# Patient Record
Sex: Female | Born: 1995 | Race: Black or African American | Hispanic: No | Marital: Single | Smoking: Never smoker
Health system: Southern US, Community
[De-identification: ages and names within clinical notes are randomized; demographics above are authoritative.]

## PROBLEM LIST (undated history)

## (undated) ENCOUNTER — Inpatient Hospital Stay: Payer: Self-pay

## (undated) ENCOUNTER — Ambulatory Visit: Admission: EM | Payer: 59 | Source: Home / Self Care

## (undated) DIAGNOSIS — F419 Anxiety disorder, unspecified: Secondary | ICD-10-CM

## (undated) DIAGNOSIS — J45909 Unspecified asthma, uncomplicated: Secondary | ICD-10-CM

## (undated) HISTORY — DX: Anxiety disorder, unspecified: F41.9

---
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
## 2017-01-02 ENCOUNTER — Ambulatory Visit (INDEPENDENT_AMBULATORY_CARE_PROVIDER_SITE_OTHER): Payer: Medicaid Other | Admitting: Certified Nurse Midwife

## 2017-01-02 ENCOUNTER — Other Ambulatory Visit: Payer: Self-pay | Admitting: Obstetrics and Gynecology

## 2017-01-02 ENCOUNTER — Encounter: Payer: Self-pay | Admitting: Certified Nurse Midwife

## 2017-01-02 VITALS — BP 104/67 | HR 89 | Wt 180.7 lb

## 2017-01-02 DIAGNOSIS — Z3403 Encounter for supervision of normal first pregnancy, third trimester: Secondary | ICD-10-CM

## 2017-01-02 DIAGNOSIS — Z23 Encounter for immunization: Secondary | ICD-10-CM

## 2017-01-02 DIAGNOSIS — Z369 Encounter for antenatal screening, unspecified: Secondary | ICD-10-CM

## 2017-01-02 DIAGNOSIS — Z113 Encounter for screening for infections with a predominantly sexual mode of transmission: Secondary | ICD-10-CM

## 2017-01-02 NOTE — Progress Notes (Addendum)
ROB- pt is still having tightness and pressure, flu vaccine today, pt unable to give urine sample today

## 2017-01-02 NOTE — Patient Instructions (Signed)

## 2017-01-02 NOTE — Addendum Note (Signed)
Addended by: Garfield CorneaMABRY, Deane Wattenbarger L on: 01/02/2017 03:55 PM   Modules accepted: Orders

## 2017-01-02 NOTE — Progress Notes (Signed)
ROB doing well. No complaints. Discussed GBS results from hospital and need to repeat test for sensitivity. Pt verbalizes understanding and agrees to plan. GBS repeated today. Follow up 1 1/2 wks .   Doreene BurkeAnnie Ronisha Herringshaw, CNM

## 2017-01-07 LAB — STREP GP B SUSCEPTIBILITY

## 2017-01-07 LAB — STREP GP B NAA+RFLX: Strep Gp B NAA+Rflx: POSITIVE — AB

## 2017-01-09 ENCOUNTER — Encounter: Payer: Medicaid Other | Admitting: Certified Nurse Midwife

## 2017-01-12 ENCOUNTER — Ambulatory Visit (INDEPENDENT_AMBULATORY_CARE_PROVIDER_SITE_OTHER): Payer: Medicaid Other | Admitting: Obstetrics and Gynecology

## 2017-01-12 VITALS — BP 128/66 | HR 92 | Wt 185.5 lb

## 2017-01-12 DIAGNOSIS — Z3493 Encounter for supervision of normal pregnancy, unspecified, third trimester: Secondary | ICD-10-CM

## 2017-01-12 LAB — POCT URINALYSIS DIPSTICK
BILIRUBIN UA: NEGATIVE
GLUCOSE UA: NEGATIVE
KETONES UA: NEGATIVE
Nitrite, UA: NEGATIVE
PH UA: 7.5 (ref 5.0–8.0)
Protein, UA: NEGATIVE
RBC UA: NEGATIVE
Spec Grav, UA: 1.01 (ref 1.010–1.025)
Urobilinogen, UA: 0.2 E.U./dL

## 2017-01-12 NOTE — Progress Notes (Signed)
ROB-LABOR PRECAUTIONS DISCUSSED, Gc/CMZ repeated.

## 2017-01-12 NOTE — Progress Notes (Signed)
ROB- pt is having some pelvic pressure, hurting in her lower back

## 2017-01-14 LAB — GC/CHLAMYDIA PROBE AMP
CHLAMYDIA, DNA PROBE: NEGATIVE
NEISSERIA GONORRHOEAE BY PCR: NEGATIVE

## 2017-01-16 ENCOUNTER — Other Ambulatory Visit: Payer: Self-pay | Admitting: *Deleted

## 2017-01-16 ENCOUNTER — Telehealth: Payer: Self-pay | Admitting: Obstetrics and Gynecology

## 2017-01-16 ENCOUNTER — Encounter: Payer: Self-pay | Admitting: *Deleted

## 2017-01-16 NOTE — Telephone Encounter (Signed)
Patient called and stated that she needs a not for work, the patient stated that "she is due in 1 week and her job stated that she need to come in to work or they will take her off the schedule." The patient stated that she is uncomfortable and not able to work and stand on her feet at work. The patient did not disclose any other information but would like to have a call back from a nurse. Please advise.

## 2017-01-16 NOTE — Telephone Encounter (Signed)
Note done put up front

## 2017-01-17 ENCOUNTER — Encounter: Payer: Self-pay | Admitting: *Deleted

## 2017-01-17 ENCOUNTER — Other Ambulatory Visit: Payer: Self-pay

## 2017-01-17 ENCOUNTER — Inpatient Hospital Stay
Admission: EM | Admit: 2017-01-17 | Discharge: 2017-01-17 | Disposition: A | Discharge status: Home / Self Care | Payer: BC Managed Care – PPO | Attending: Obstetrics and Gynecology | Admitting: Obstetrics and Gynecology

## 2017-01-17 DIAGNOSIS — Z3A36 36 weeks gestation of pregnancy: Secondary | ICD-10-CM

## 2017-01-17 DIAGNOSIS — O26893 Other specified pregnancy related conditions, third trimester: Secondary | ICD-10-CM | POA: Diagnosis not present

## 2017-01-17 DIAGNOSIS — M545 Low back pain: Secondary | ICD-10-CM

## 2017-01-17 DIAGNOSIS — O4703 False labor before 37 completed weeks of gestation, third trimester: Secondary | ICD-10-CM

## 2017-01-17 NOTE — Discharge Instructions (Signed)
Come back if: ° °Big gush of fluids °Decreased fetal movement °Heavy vaginal bleeding °Temp over 100.4 °Contractions every 3-5 min lasting at least one hour ° °Get plenty of rest and stay well hydrated! °

## 2017-01-17 NOTE — OB Triage Provider Note (Signed)
L&D OB Triage Note  Savannah CoyerBrianna N Carson is a 21 y.o. G1P0 female at 8467w5d, EDD Estimated Date of Delivery: 02/09/17 who presented to triage for complaints of irregular uterine contractions since last night and low back pain.  She was evaluated with no significant findings/findings significant for labor. Vital signs stable. An NST was performed and has been reviewed by me. She was ambulating for an hour and no cervical change. Cervix 2-3/60/-2, uterine contractions q425minutes and mild to palpation.  NST INTERPRETATION: Indications: rule out uterine contractions  Mode: External Baseline Rate (A): 145 bpm Variability: Moderate            Impression: reactive   Plan: NST performed was reviewed and was found to be reactive. She was discharged home with bleeding/labor precautions.  Continue routine prenatal care. Follow up with OB/GYN as previously scheduled.     Datron Brakebill [name], CNM

## 2017-01-17 NOTE — OB Triage Note (Signed)
Recvd to OBS4 per wheelchair, from ED, with c/o contractions.  Changed to gown and to bed.  Oriented to room and plan of care discussed.  Verbalized understanding.and agrees with plan.

## 2017-01-19 ENCOUNTER — Ambulatory Visit (INDEPENDENT_AMBULATORY_CARE_PROVIDER_SITE_OTHER): Payer: Medicaid Other | Admitting: Obstetrics and Gynecology

## 2017-01-19 VITALS — BP 129/67 | HR 87 | Wt 186.6 lb

## 2017-01-19 DIAGNOSIS — Z3493 Encounter for supervision of normal pregnancy, unspecified, third trimester: Secondary | ICD-10-CM

## 2017-01-19 LAB — POCT URINALYSIS DIPSTICK
BILIRUBIN UA: NEGATIVE
GLUCOSE UA: NEGATIVE
Ketones, UA: NEGATIVE
NITRITE UA: NEGATIVE
Protein, UA: NEGATIVE
Spec Grav, UA: 1.01 (ref 1.010–1.025)
Urobilinogen, UA: 0.2 E.U./dL
pH, UA: 6 (ref 5.0–8.0)

## 2017-01-19 NOTE — Progress Notes (Signed)
ROB-doing well, less cramping than before. Labor precautions discussed.

## 2017-01-19 NOTE — Progress Notes (Signed)
ROB- pt is having some menstrual cramping, some pressure

## 2017-01-26 ENCOUNTER — Ambulatory Visit (INDEPENDENT_AMBULATORY_CARE_PROVIDER_SITE_OTHER): Payer: Medicaid Other | Admitting: Certified Nurse Midwife

## 2017-01-26 VITALS — BP 113/62 | HR 82 | Wt 188.4 lb

## 2017-01-26 DIAGNOSIS — Z3493 Encounter for supervision of normal pregnancy, unspecified, third trimester: Secondary | ICD-10-CM

## 2017-01-26 LAB — POCT URINALYSIS DIPSTICK
Bilirubin, UA: NEGATIVE
Blood, UA: NEGATIVE
Glucose, UA: NEGATIVE
Ketones, UA: NEGATIVE
NITRITE UA: NEGATIVE
PH UA: 6 (ref 5.0–8.0)
PROTEIN UA: NEGATIVE
Spec Grav, UA: 1.01 (ref 1.010–1.025)
Urobilinogen, UA: 0.2 E.U./dL

## 2017-01-26 NOTE — Progress Notes (Signed)
ROB- pt is having low back pain, pelvic pressure, having some contractions

## 2017-01-26 NOTE — Patient Instructions (Signed)
Vaginal Delivery Vaginal delivery means that you will give birth by pushing your baby out of your birth canal (vagina). A team of health care providers will help you before, during, and after vaginal delivery. Birth experiences are unique for every woman and every pregnancy, and birth experiences vary depending on where you choose to give birth. What should I do to prepare for my baby's birth? Before your baby is born, it is important to talk with your health care provider about:  Your labor and delivery preferences. These may include: ? Medicines that you may be given. ? How you will manage your pain. This might include non-medical pain relief techniques or injectable pain relief such as epidural analgesia. ? How you and your baby will be monitored during labor and delivery. ? Who may be in the labor and delivery room with you. ? Your feelings about surgical delivery of your baby (cesarean delivery, or C-section) if this becomes necessary. ? Your feelings about receiving donated blood through an IV tube (blood transfusion) if this becomes necessary.  Whether you are able: ? To take pictures or videos of the birth. ? To eat during labor and delivery. ? To move around, walk, or change positions during labor and delivery.  What to expect after your baby is born, such as: ? Whether delayed umbilical cord clamping and cutting is offered. ? Who will care for your baby right after birth. ? Medicines or tests that may be recommended for your baby. ? Whether breastfeeding is supported in your hospital or birth center. ? How long you will be in the hospital or birth center.  How any medical conditions you have may affect your baby or your labor and delivery experience.  To prepare for your baby's birth, you should also:  Attend all of your health care visits before delivery (prenatal visits) as recommended by your health care provider. This is important.  Prepare your home for your baby's  arrival. Make sure that you have: ? Diapers. ? Baby clothing. ? Feeding equipment. ? Safe sleeping arrangements for you and your baby.  Install a car seat in your vehicle. Have your car seat checked by a certified car seat installer to make sure that it is installed safely.  Think about who will help you with your new baby at home for at least the first several weeks after delivery.  What can I expect when I arrive at the birth center or hospital? Once you are in labor and have been admitted into the hospital or birth center, your health care provider may:  Review your pregnancy history and any concerns you have.  Insert an IV tube into one of your veins. This is used to give you fluids and medicines.  Check your blood pressure, pulse, temperature, and heart rate (vital signs).  Check whether your bag of water (amniotic sac) has broken (ruptured).  Talk with you about your birth plan and discuss pain control options.  Monitoring Your health care provider may monitor your contractions (uterine monitoring) and your baby's heart rate (fetal monitoring). You may need to be monitored:  Often, but not continuously (intermittently).  All the time or for long periods at a time (continuously). Continuous monitoring may be needed if: ? You are taking certain medicines, such as medicine to relieve pain or make your contractions stronger. ? You have pregnancy or labor complications.  Monitoring may be done by:  Placing a special stethoscope or a handheld monitoring device on your abdomen to   check your baby's heartbeat, and feeling your abdomen for contractions. This method of monitoring does not continuously record your baby's heartbeat or your contractions.  Placing monitors on your abdomen (external monitors) to record your baby's heartbeat and the frequency and length of contractions. You may not have to wear external monitors all the time.  Placing monitors inside of your uterus  (internal monitors) to record your baby's heartbeat and the frequency, length, and strength of your contractions. ? Your health care provider may use internal monitors if he or she needs more information about the strength of your contractions or your baby's heart rate. ? Internal monitors are put in place by passing a thin, flexible wire through your vagina and into your uterus. Depending on the type of monitor, it may remain in your uterus or on your baby's head until birth. ? Your health care provider will discuss the benefits and risks of internal monitoring with you and will ask for your permission before inserting the monitors.  Telemetry. This is a type of continuous monitoring that can be done with external or internal monitors. Instead of having to stay in bed, you are able to move around during telemetry. Ask your health care provider if telemetry is an option for you.  Physical exam Your health care provider may perform a physical exam. This may include:  Checking whether your baby is positioned: ? With the head toward your vagina (head-down). This is most common. ? With the head toward the top of your uterus (head-up or breech). If your baby is in a breech position, your health care provider may try to turn your baby to a head-down position so you can deliver vaginally. If it does not seem that your baby can be born vaginally, your provider may recommend surgery to deliver your baby. In rare cases, you may be able to deliver vaginally if your baby is head-up (breech delivery). ? Lying sideways (transverse). Babies that are lying sideways cannot be delivered vaginally.  Checking your cervix to determine: ? Whether it is thinning out (effacing). ? Whether it is opening up (dilating). ? How low your baby has moved into your birth canal.  What are the three stages of labor and delivery?  Normal labor and delivery is divided into the following three stages: Stage 1  Stage 1 is the  longest stage of labor, and it can last for hours or days. Stage 1 includes: ? Early labor. This is when contractions may be irregular, or regular and mild. Generally, early labor contractions are more than 10 minutes apart. ? Active labor. This is when contractions get longer, more regular, more frequent, and more intense. ? The transition phase. This is when contractions happen very close together, are very intense, and may last longer than during any other part of labor.  Contractions generally feel mild, infrequent, and irregular at first. They get stronger, more frequent (about every 2-3 minutes), and more regular as you progress from early labor through active labor and transition.  Many women progress through stage 1 naturally, but you may need help to continue making progress. If this happens, your health care provider may talk with you about: ? Rupturing your amniotic sac if it has not ruptured yet. ? Giving you medicine to help make your contractions stronger and more frequent.  Stage 1 ends when your cervix is completely dilated to 4 inches (10 cm) and completely effaced. This happens at the end of the transition phase. Stage 2  Once   your cervix is completely effaced and dilated to 4 inches (10 cm), you may start to feel an urge to push. It is common for the body to naturally take a rest before feeling the urge to push, especially if you received an epidural or certain other pain medicines. This rest period may last for up to 1-2 hours, depending on your unique labor experience.  During stage 2, contractions are generally less painful, because pushing helps relieve contraction pain. Instead of contraction pain, you may feel stretching and burning pain, especially when the widest part of your baby's head passes through the vaginal opening (crowning).  Your health care provider will closely monitor your pushing progress and your baby's progress through the vagina during stage 2.  Your  health care provider may massage the area of skin between your vaginal opening and anus (perineum) or apply warm compresses to your perineum. This helps it stretch as the baby's head starts to crown, which can help prevent perineal tearing. ? In some cases, an incision may be made in your perineum (episiotomy) to allow the baby to pass through the vaginal opening. An episiotomy helps to make the opening of the vagina larger to allow more room for the baby to fit through.  It is very important to breathe and focus so your health care provider can control the delivery of your baby's head. Your health care provider may have you decrease the intensity of your pushing, to help prevent perineal tearing.  After delivery of your baby's head, the shoulders and the rest of the body generally deliver very quickly and without difficulty.  Once your baby is delivered, the umbilical cord may be cut right away, or this may be delayed for 1-2 minutes, depending on your baby's health. This may vary among health care providers, hospitals, and birth centers.  If you and your baby are healthy enough, your baby may be placed on your chest or abdomen to help maintain the baby's temperature and to help you bond with each other. Some mothers and babies start breastfeeding at this time. Your health care team will dry your baby and help keep your baby warm during this time.  Your baby may need immediate care if he or she: ? Showed signs of distress during labor. ? Has a medical condition. ? Was born too early (prematurely). ? Had a bowel movement before birth (meconium). ? Shows signs of difficulty transitioning from being inside the uterus to being outside of the uterus. If you are planning to breastfeed, your health care team will help you begin a feeding. Stage 3  The third stage of labor starts immediately after the birth of your baby and ends after you deliver the placenta. The placenta is an organ that develops  during pregnancy to provide oxygen and nutrients to your baby in the womb.  Delivering the placenta may require some pushing, and you may have mild contractions. Breastfeeding can stimulate contractions to help you deliver the placenta.  After the placenta is delivered, your uterus should tighten (contract) and become firm. This helps to stop bleeding in your uterus. To help your uterus contract and to control bleeding, your health care provider may: ? Give you medicine by injection, through an IV tube, by mouth, or through your rectum (rectally). ? Massage your abdomen or perform a vaginal exam to remove any blood clots that are left in your uterus. ? Empty your bladder by placing a thin, flexible tube (catheter) into your bladder. ? Encourage   you to breastfeed your baby. After labor is over, you and your baby will be monitored closely to ensure that you are both healthy until you are ready to go home. Your health care team will teach you how to care for yourself and your baby. This information is not intended to replace advice given to you by your health care provider. Make sure you discuss any questions you have with your health care provider. Document Released: 12/07/2007 Document Revised: 09/17/2015 Document Reviewed: 03/14/2015 Elsevier Interactive Patient Education  2018 [employer]  

## 2017-01-27 NOTE — Progress Notes (Signed)
ROB-Reports pelvic pressure, low back pain, and occasional contractions. Discussed home treatment measures. Reviewed red flag symptoms and when to call. RTC x 1 week for ROB with Pattricia BossAnnie or sooner if needed.

## 2017-01-30 ENCOUNTER — Ambulatory Visit (INDEPENDENT_AMBULATORY_CARE_PROVIDER_SITE_OTHER): Payer: Medicaid Other | Admitting: Certified Nurse Midwife

## 2017-01-30 ENCOUNTER — Other Ambulatory Visit: Payer: Self-pay

## 2017-01-30 ENCOUNTER — Encounter: Payer: Self-pay | Admitting: Certified Nurse Midwife

## 2017-01-30 VITALS — BP 104/57 | HR 83 | Wt 188.5 lb

## 2017-01-30 DIAGNOSIS — Z3493 Encounter for supervision of normal pregnancy, unspecified, third trimester: Secondary | ICD-10-CM

## 2017-01-30 LAB — POCT URINALYSIS DIPSTICK
BILIRUBIN UA: NEGATIVE
Glucose, UA: NEGATIVE
Ketones, UA: NEGATIVE
NITRITE UA: NEGATIVE
PH UA: 6 (ref 5.0–8.0)
PROTEIN UA: NEGATIVE
RBC UA: NEGATIVE
Spec Grav, UA: 1.02 (ref 1.010–1.025)
UROBILINOGEN UA: 0.2 U/dL

## 2017-01-30 NOTE — Patient Instructions (Signed)

## 2017-01-30 NOTE — Progress Notes (Signed)
Savannah Carson, doing well. Endorses good fetal movement. Discussed induction at 41 wks. She verbalizes understanding and agrees to plan. She declines SVE today. Follow up in 1 wk.   Savannah BurkeAnnie Keishana Carson, CNM

## 2017-02-05 ENCOUNTER — Observation Stay
Admission: EM | Admit: 2017-02-05 | Discharge: 2017-02-06 | Disposition: A | Discharge status: Home / Self Care | Payer: BC Managed Care – PPO | Source: Home / Self Care | Admitting: Obstetrics and Gynecology

## 2017-02-05 ENCOUNTER — Other Ambulatory Visit: Payer: Self-pay

## 2017-02-05 NOTE — Procedures (Addendum)
Spoke with Harlow MaresMelody Shambley, CNM. Notified of patient complaint, UC pattern and EFM pattern.  Patient states she felt fetal movement twice after the monitors were applied. Per CNM monitor patient for 45 minutes and if NST is reactive & no further concerns patient may be discharged home with instructions to keep her weekly appointment.

## 2017-02-05 NOTE — OB Triage Note (Signed)
Patient presented to L&D with complaints of no fetal movement since 1am last night.  Denies leaking fluid, vaginal bleeding. States she has been having some cramping and lower back pain.

## 2017-02-06 ENCOUNTER — Inpatient Hospital Stay: Payer: BC Managed Care – PPO | Admitting: Anesthesiology

## 2017-02-06 ENCOUNTER — Encounter: Payer: Medicaid Other | Admitting: Certified Nurse Midwife

## 2017-02-06 ENCOUNTER — Encounter: Payer: Self-pay | Admitting: Anesthesiology

## 2017-02-06 ENCOUNTER — Inpatient Hospital Stay
Admission: EM | Admit: 2017-02-06 | Discharge: 2017-02-09 | DRG: 806 | Disposition: A | Discharge status: Home / Self Care | Payer: BC Managed Care – PPO | Attending: Obstetrics and Gynecology | Admitting: Obstetrics and Gynecology

## 2017-02-06 DIAGNOSIS — Z3A39 39 weeks gestation of pregnancy: Secondary | ICD-10-CM

## 2017-02-06 DIAGNOSIS — O99824 Streptococcus B carrier state complicating childbirth: Secondary | ICD-10-CM | POA: Diagnosis present

## 2017-02-06 DIAGNOSIS — O368130 Decreased fetal movements, third trimester, not applicable or unspecified: Secondary | ICD-10-CM | POA: Diagnosis present

## 2017-02-06 DIAGNOSIS — Z880 Allergy status to penicillin: Secondary | ICD-10-CM | POA: Diagnosis not present

## 2017-02-06 DIAGNOSIS — J45909 Unspecified asthma, uncomplicated: Secondary | ICD-10-CM | POA: Diagnosis present

## 2017-02-06 DIAGNOSIS — K219 Gastro-esophageal reflux disease without esophagitis: Secondary | ICD-10-CM | POA: Diagnosis present

## 2017-02-06 DIAGNOSIS — F1290 Cannabis use, unspecified, uncomplicated: Secondary | ICD-10-CM | POA: Diagnosis present

## 2017-02-06 DIAGNOSIS — O9952 Diseases of the respiratory system complicating childbirth: Secondary | ICD-10-CM | POA: Diagnosis present

## 2017-02-06 DIAGNOSIS — O9962 Diseases of the digestive system complicating childbirth: Secondary | ICD-10-CM | POA: Diagnosis present

## 2017-02-06 DIAGNOSIS — O99324 Drug use complicating childbirth: Secondary | ICD-10-CM | POA: Diagnosis present

## 2017-02-06 DIAGNOSIS — Z3483 Encounter for supervision of other normal pregnancy, third trimester: Secondary | ICD-10-CM | POA: Diagnosis present

## 2017-02-06 LAB — TYPE AND SCREEN
ABO/RH(D): A POS
ANTIBODY SCREEN: NEGATIVE

## 2017-02-06 LAB — CBC
HEMATOCRIT: 35.6 % (ref 35.0–47.0)
Hemoglobin: 12 g/dL (ref 12.0–16.0)
MCH: 27.2 pg (ref 26.0–34.0)
MCHC: 33.8 g/dL (ref 32.0–36.0)
MCV: 80.4 fL (ref 80.0–100.0)
PLATELETS: 278 10*3/uL (ref 150–440)
RBC: 4.43 MIL/uL (ref 3.80–5.20)
RDW: 13.9 % (ref 11.5–14.5)
WBC: 8.1 10*3/uL (ref 3.6–11.0)

## 2017-02-06 LAB — URINE DRUG SCREEN, QUALITATIVE (ARMC ONLY)
Amphetamines, Ur Screen: NOT DETECTED
BARBITURATES, UR SCREEN: NOT DETECTED
BENZODIAZEPINE, UR SCRN: NOT DETECTED
CANNABINOID 50 NG, UR ~~LOC~~: POSITIVE — AB
Cocaine Metabolite,Ur ~~LOC~~: NOT DETECTED
MDMA (ECSTASY) UR SCREEN: NOT DETECTED
Methadone Scn, Ur: NOT DETECTED
Opiate, Ur Screen: NOT DETECTED
PHENCYCLIDINE (PCP) UR S: NOT DETECTED
TRICYCLIC, UR SCREEN: NOT DETECTED

## 2017-02-06 MED ORDER — FENTANYL CITRATE (PF) 100 MCG/2ML IJ SOLN: 50.0000 ug | INTRAMUSCULAR | Status: DC | PRN

## 2017-02-06 MED ORDER — CLINDAMYCIN PHOSPHATE 900 MG/50ML IV SOLN
900.0000 mg | Freq: Three times a day (TID) | INTRAVENOUS | Status: DC
  Administered 2017-02-06: 900 mg via INTRAVENOUS
  Filled 2017-02-06 (×3): qty 50

## 2017-02-06 MED ORDER — LIDOCAINE HCL (PF) 1 % IJ SOLN: 30.0000 mL | INTRAMUSCULAR | Status: DC | PRN

## 2017-02-06 MED ORDER — LIDOCAINE HCL (PF) 1 % IJ SOLN
INTRAMUSCULAR | Status: AC
  Filled 2017-02-06: qty 30

## 2017-02-06 MED ORDER — EPHEDRINE 5 MG/ML INJ
10.0000 mg | INTRAVENOUS | Status: DC | PRN
  Filled 2017-02-06: qty 2

## 2017-02-06 MED ORDER — OXYTOCIN 10 UNIT/ML IJ SOLN
INTRAMUSCULAR | Status: AC
  Filled 2017-02-06: qty 2

## 2017-02-06 MED ORDER — MISOPROSTOL 200 MCG PO TABS
ORAL_TABLET | ORAL | Status: AC
  Filled 2017-02-06: qty 4

## 2017-02-06 MED ORDER — LIDOCAINE HCL (PF) 1 % IJ SOLN
INTRAMUSCULAR | Status: DC | PRN
  Administered 2017-02-06: 1 mL via INTRADERMAL

## 2017-02-06 MED ORDER — PHENYLEPHRINE 40 MCG/ML (10ML) SYRINGE FOR IV PUSH (FOR BLOOD PRESSURE SUPPORT)
80.0000 ug | PREFILLED_SYRINGE | INTRAVENOUS | Status: DC | PRN
  Filled 2017-02-06: qty 5

## 2017-02-06 MED ORDER — FENTANYL 2.5 MCG/ML W/ROPIVACAINE 0.15% IN NS 100 ML EPIDURAL (ARMC)
12.0000 mL/h | EPIDURAL | Status: DC
  Administered 2017-02-06: 12 mL/h via EPIDURAL

## 2017-02-06 MED ORDER — SOD CITRATE-CITRIC ACID 500-334 MG/5ML PO SOLN: 30.0000 mL | ORAL | Status: DC | PRN

## 2017-02-06 MED ORDER — LACTATED RINGERS IV SOLN: 500.0000 mL | Freq: Once | INTRAVENOUS | Status: DC

## 2017-02-06 MED ORDER — SODIUM CHLORIDE 0.9 % IV SOLN
INTRAVENOUS | Status: DC | PRN
  Administered 2017-02-06 (×2): 5 mL via EPIDURAL

## 2017-02-06 MED ORDER — ACETAMINOPHEN 325 MG PO TABS: 650.0000 mg | ORAL_TABLET | ORAL | Status: DC | PRN

## 2017-02-06 MED ORDER — ONDANSETRON HCL 4 MG/2ML IJ SOLN: 4.0000 mg | Freq: Four times a day (QID) | INTRAMUSCULAR | Status: DC | PRN

## 2017-02-06 MED ORDER — LACTATED RINGERS IV SOLN
INTRAVENOUS | Status: DC
  Administered 2017-02-06: 21:00:00 via INTRAVENOUS

## 2017-02-06 MED ORDER — LACTATED RINGERS IV SOLN: 500.0000 mL | INTRAVENOUS | Status: DC | PRN

## 2017-02-06 MED ORDER — DIPHENHYDRAMINE HCL 50 MG/ML IJ SOLN: 12.5000 mg | INTRAMUSCULAR | Status: DC | PRN

## 2017-02-06 MED ORDER — FENTANYL 2.5 MCG/ML W/ROPIVACAINE 0.15% IN NS 100 ML EPIDURAL (ARMC)
EPIDURAL | Status: AC
  Filled 2017-02-06: qty 100

## 2017-02-06 MED ORDER — AMMONIA AROMATIC IN INHA
RESPIRATORY_TRACT | Status: AC
Start: 2017-02-06 — End: 2017-02-07
  Filled 2017-02-06: qty 10

## 2017-02-06 MED ORDER — LIDOCAINE-EPINEPHRINE (PF) 1.5 %-1:200000 IJ SOLN
INTRAMUSCULAR | Status: DC | PRN
Start: 2017-02-06 — End: 2017-02-07
  Administered 2017-02-06: 3 mL via EPIDURAL

## 2017-02-06 MED ORDER — OXYTOCIN 40 UNITS IN LACTATED RINGERS INFUSION - SIMPLE MED
2.5000 [IU]/h | INTRAVENOUS | Status: DC
  Filled 2017-02-06: qty 1000

## 2017-02-06 MED ORDER — EPHEDRINE 5 MG/ML INJ
10.0000 mg | INTRAVENOUS | Status: DC | PRN
Start: 2017-02-06 — End: 2017-02-07
  Filled 2017-02-06: qty 2

## 2017-02-06 MED ORDER — OXYTOCIN BOLUS FROM INFUSION
500.0000 mL | Freq: Once | INTRAVENOUS | Status: AC
  Administered 2017-02-07: 500 mL via INTRAVENOUS

## 2017-02-06 NOTE — Progress Notes (Signed)
Savannah CoyerBrianna N Carson is a 21 y.o. G1P0 at 7136w4d by LMP admitted for active labor  Subjective: Epidural just placed- awaiting pain relief  Objective: BP 135/70   Pulse 94   Resp 20   LMP 05/05/2016   SpO2 98%  No intake/output data recorded. Total I/O In: 175 [I.V.:175] Out: -   FHT:  FHR: 123 bpm, variability: moderate,  accelerations:  Present,  decelerations:  Present early UC:   regular, every 2-3 minutes SVE:   9/90/+1 per RN exam Labs: Lab Results  Component Value Date   WBC 8.1 02/06/2017   HGB 12.0 02/06/2017   HCT 35.6 02/06/2017   MCV 80.4 02/06/2017   PLT 278 02/06/2017    Assessment / Plan: Spontaneous labor, progressing normally  Labor: Progressing normally Preeclampsia:  labs stable Fetal Wellbeing:  Category I Pain Control:  Epidural I/D:  n/a Anticipated MOD:  NSVD  Melody N Shambley 02/06/2017, 11:04 PM

## 2017-02-06 NOTE — H&P (Signed)
Obstetric History and Physical  Savannah Carson is a 21 y.o. G1P0 with IUP at 10778w4d presenting with regular contractions in early labor. Patient states she has been having  regular, every 3-4 minutes contractions, none vaginal bleeding, intact membranes, with active fetal movement.    Prenatal Course [facility]  Pregnancy complications or risks:marijuana use  Prenatal labs and studies: ABO, Rh: A/Positive/-- (06/12 1718) Antibody: Negative (06/12 1718) Rubella: 3.08 (06/12 1718) RPR: Non Reactive (06/12 1718)  HBsAg: Negative (06/12 1718)  HIV:   negative GBS: positive 1 hr Glucola  normal Genetic screening normal Anatomy US normal  Past Medical History:  Diagnosis Date  . Asthma     No past surgical history on file.  OB History  Gravida Para Term Preterm AB Living  1            SAB TAB Ectopic Multiple Live Births               # Outcome Date GA Lbr Len/2nd Weight Sex Delivery Anes PTL Lv  1 Current               Social History   Socioeconomic History  . Marital status: Single    Spouse name: Not on file  . Number of children: Not on file  . Years of education: Not on file  . Highest education level: Not on file  Social Needs  . Financial resource strain: Not on file  . Food insecurity - worry: Not on file  . Food insecurity - inability: Not on file  . Transportation needs - medical: Not on file  . Transportation needs - non-medical: Not on file  Occupational History  . Not on file  Tobacco Use  . Smoking status: Never Smoker  . Smokeless tobacco: Never Used  Substance and Sexual Activity  . Alcohol use: No  . Drug use: No  . Sexual activity: Yes    Birth control/protection: None  Other Topics Concern  . Not on file  Social History Narrative  . Not on file    No family history on file.  Medications Prior to Admission  Medication Sig Dispense Refill Last Dose  . albuterol (PROVENTIL HFA;VENTOLIN HFA) 108 (90 Base) MCG/ACT inhaler  Inhale into the lungs.   Taking  . Prenatal Vit-Fe Fumarate-FA (PRENATAL MULTIVITAMIN) TABS tablet Take 1 tablet by mouth daily at 12 noon.   Taking  . ranitidine (ZANTAC) 150 MG tablet Take 1 tablet (150 mg total) by mouth 2 (two) times daily. 60 tablet 1 Taking  . sucralfate (CARAFATE) 1 GM/10ML suspension Take 10 mLs (1 g total) by mouth 3 (three) times daily as needed. (Patient not taking: Reported on 12/29/2016) 420 mL 1 Not Taking    Allergies  Allergen Reactions  . Augmentin [Amoxicillin-Pot Clavulanate] Hives  . Cephalosporins Hives  . Penicillins Rash  . Sulfamethoxazole-Trimethoprim Rash    Review of Systems: Negative except for what is mentioned in HPI.  Physical Exam: BP 135/84   Pulse 89   Resp 20   LMP 05/05/2016  GENERAL: Well-developed, well-nourished female in no acute distress.  LUNGS: Clear to auscultation bilaterally.  HEART: Regular rate and rhythm. ABDOMEN: Soft, nontender, nondistended, gravid. EXTREMITIES: Nontender, no edema, 2+ distal pulses. Cervical Exam: Dilation: 4 Effacement (%): 100 Station: -2 Presentation: Vertex Exam by:: TR RN FHT:  Baseline rate 145 bpm   Variability moderate  Accelerations present   Decelerations none Contractions: Every 3 mins   Pertinent Labs/Studies:  Results for orders placed or performed during the hospital encounter of 02/06/17 (from the past 24 hour(s))  CBC     Status: None   Collection Time: 02/06/17  7:47 PM  Result Value Ref Range   WBC 8.1 3.6 - 11.0 K/uL   RBC 4.43 3.80 - 5.20 MIL/uL   Hemoglobin 12.0 12.0 - 16.0 g/dL   HCT 40.935.6 81.135.0 - 91.447.0 %   MCV 80.4 80.0 - 100.0 fL   MCH 27.2 26.0 - 34.0 pg   MCHC 33.8 32.0 - 36.0 g/dL   RDW 78.213.9 95.611.5 - 21.314.5 %   Platelets 278 150 - 440 K/uL    Assessment : Savannah CoyerBrianna N Carson is a 21 y.o. G1P0 at 6819w4d being admitted for labor.  Plan: Labor: Expectant management.  Induction/Augmentation as needed, per protocol FWB: Reassuring fetal heart tracing.  GBS  positive Delivery plan: Hopeful for vaginal delivery  Keyanni Whittinghill, CNM [facility]

## 2017-02-06 NOTE — Anesthesia Procedure Notes (Signed)
Epidural Patient location during procedure: OB Start time: 02/06/2017 10:45 PM End time: 02/06/2017 10:50 PM  Staffing Anesthesiologist: Josanne Boerema, Cleda MccreedyJoseph K, MD Performed: anesthesiologist   Preanesthetic Checklist Completed: patient identified, site marked, surgical consent, pre-op evaluation, timeout performed, IV checked, risks and benefits discussed and monitors and equipment checked  Epidural Patient position: sitting Prep: Betadine Patient monitoring: heart rate, continuous pulse ox and blood pressure Approach: midline Location: L3-L4 Injection technique: LOR saline  Needle:  Needle type: Tuohy  Needle gauge: 17 G Needle length: 9 cm and 9 Needle insertion depth: 6 cm Catheter type: closed end flexible Catheter size: 19 Gauge Catheter at skin depth: 11 cm Test dose: negative and 1.5% lidocaine with Epi 1:200 K  Assessment Sensory level: T10 Events: blood not aspirated, injection not painful, no injection resistance, negative IV test and no paresthesia  Additional Notes 1 attempt  Pt. Evaluated and documentation done after procedure finished. Patient identified. Risks/Benefits/Options discussed with patient including but not limited to bleeding, infection, nerve damage, paralysis, failed block, incomplete pain control, headache, blood pressure changes, nausea, vomiting, reactions to medication both or allergic, itching and postpartum back pain. Confirmed with bedside nurse the patient's most recent platelet count. Confirmed with patient that they are not currently taking any anticoagulation, have any bleeding history or any family history of bleeding disorders. Patient expressed understanding and wished to proceed. All questions were answered. Sterile technique was used throughout the entire procedure. Please see nursing notes for vital signs. Test dose was given through epidural catheter and negative prior to continuing to dose epidural or start infusion. Warning signs of  high block given to the patient including shortness of breath, tingling/numbness in hands, complete motor block, or any concerning symptoms with instructions to call for help. Patient was given instructions on fall risk and not to get out of bed. All questions and concerns addressed with instructions to call with any issues or inadequate analgesia.   Patient tolerated the insertion well without immediate complications.Reason for block:procedure for pain

## 2017-02-06 NOTE — Anesthesia Preprocedure Evaluation (Signed)
Anesthesia Evaluation  Patient identified by MRN, date of birth, ID band Patient awake    Reviewed: Allergy & Precautions, H&P , NPO status , Patient's Chart, lab work & pertinent test results  Airway Mallampati: III  TM Distance: >3 FB Neck ROM: full    Dental  (+) Chipped   Pulmonary neg pulmonary ROS, asthma ,           Cardiovascular Exercise Tolerance: Good (-) hypertensionnegative cardio ROS       Neuro/Psych    GI/Hepatic GERD  ,  Endo/Other    Renal/GU   negative genitourinary   Musculoskeletal   Abdominal   Peds  Hematology negative hematology ROS (+)   Anesthesia Other Findings Past Medical History: No date: Asthma  History reviewed. No pertinent surgical history.     Reproductive/Obstetrics (+) Pregnancy                             Anesthesia Physical Anesthesia Plan  ASA: III  Anesthesia Plan: Epidural   Post-op Pain Management:    Induction:   PONV Risk Score and Plan:   Airway Management Planned:   Additional Equipment:   Intra-op Plan:   Post-operative Plan:   Informed Consent: I have reviewed the patients History and Physical, chart, labs and discussed the procedure including the risks, benefits and alternatives for the proposed anesthesia with the patient or authorized representative who has indicated his/her understanding and acceptance.     Plan Discussed with: Anesthesiologist  Anesthesia Plan Comments:         Anesthesia Quick Evaluation

## 2017-02-07 ENCOUNTER — Encounter: Payer: Self-pay | Admitting: *Deleted

## 2017-02-07 DIAGNOSIS — Z3A39 39 weeks gestation of pregnancy: Secondary | ICD-10-CM

## 2017-02-07 DIAGNOSIS — O368130 Decreased fetal movements, third trimester, not applicable or unspecified: Principal | ICD-10-CM

## 2017-02-07 LAB — CHLAMYDIA/NGC RT PCR (ARMC ONLY)
CHLAMYDIA TR: NOT DETECTED
N gonorrhoeae: NOT DETECTED

## 2017-02-07 LAB — CBC
HEMATOCRIT: 33.8 % — AB (ref 35.0–47.0)
Hemoglobin: 11.2 g/dL — ABNORMAL LOW (ref 12.0–16.0)
MCH: 26.7 pg (ref 26.0–34.0)
MCHC: 33.2 g/dL (ref 32.0–36.0)
MCV: 80.3 fL (ref 80.0–100.0)
PLATELETS: 304 10*3/uL (ref 150–440)
RBC: 4.2 MIL/uL (ref 3.80–5.20)
RDW: 13.6 % (ref 11.5–14.5)
WBC: 19.2 10*3/uL — ABNORMAL HIGH (ref 3.6–11.0)

## 2017-02-07 MED ORDER — IBUPROFEN 600 MG PO TABS
ORAL_TABLET | ORAL | Status: AC
  Filled 2017-02-07: qty 1

## 2017-02-07 MED ORDER — COCONUT OIL OIL: 1.0000 "application " | TOPICAL_OIL | Status: DC | PRN

## 2017-02-07 MED ORDER — ONDANSETRON HCL 4 MG PO TABS
4.0000 mg | ORAL_TABLET | ORAL | Status: DC | PRN
Start: 2017-02-07 — End: 2017-02-09

## 2017-02-07 MED ORDER — PRENATAL MULTIVITAMIN CH
1.0000 | ORAL_TABLET | Freq: Every day | ORAL | Status: DC
  Administered 2017-02-07 – 2017-02-09 (×3): 1 via ORAL
  Filled 2017-02-07 (×3): qty 1

## 2017-02-07 MED ORDER — DIPHENHYDRAMINE HCL 25 MG PO CAPS: 25.0000 mg | ORAL_CAPSULE | Freq: Four times a day (QID) | ORAL | Status: DC | PRN

## 2017-02-07 MED ORDER — SENNOSIDES-DOCUSATE SODIUM 8.6-50 MG PO TABS
2.0000 | ORAL_TABLET | ORAL | Status: DC
  Filled 2017-02-07: qty 2

## 2017-02-07 MED ORDER — DIBUCAINE 1 % RE OINT: 1.0000 "application " | TOPICAL_OINTMENT | RECTAL | Status: DC | PRN

## 2017-02-07 MED ORDER — IBUPROFEN 600 MG PO TABS
600.0000 mg | ORAL_TABLET | Freq: Four times a day (QID) | ORAL | Status: DC
  Administered 2017-02-07 – 2017-02-09 (×7): 600 mg via ORAL
  Filled 2017-02-07 (×8): qty 1

## 2017-02-07 MED ORDER — BENZOCAINE-MENTHOL 20-0.5 % EX AERO: 1.0000 "application " | INHALATION_SPRAY | CUTANEOUS | Status: DC | PRN

## 2017-02-07 MED ORDER — ONDANSETRON HCL 4 MG/2ML IJ SOLN: 4.0000 mg | INTRAMUSCULAR | Status: DC | PRN

## 2017-02-07 MED ORDER — ACETAMINOPHEN 325 MG PO TABS: 650.0000 mg | ORAL_TABLET | ORAL | Status: DC | PRN

## 2017-02-07 MED ORDER — SIMETHICONE 80 MG PO CHEW: 80.0000 mg | CHEWABLE_TABLET | ORAL | Status: DC | PRN

## 2017-02-07 MED ORDER — WITCH HAZEL-GLYCERIN EX PADS: 1.0000 "application " | MEDICATED_PAD | CUTANEOUS | Status: DC | PRN

## 2017-02-07 NOTE — Progress Notes (Signed)
Post Partum Day 1 Subjective: no complaints, up ad lib and voiding  Objective: Blood pressure 113/75, pulse 79, temperature 98.1 F (36.7 C), temperature source Oral, resp. rate 18, last menstrual period 05/05/2016, SpO2 99 %, unknown if currently breastfeeding.  Physical Exam:  General: alert, cooperative, appears stated age and fatigued Lochia: appropriate Uterine Fundus: firm Incision: NA DVT Evaluation: No evidence of DVT seen on physical exam. Negative Homan's sign.  Recent Labs    02/06/17 1947 02/07/17 0600  HGB 12.0 11.2*  HCT 35.6 33.8*    Assessment/Plan: Plan for discharge tomorrow Infant feeding  Bottle    LOS: 1 day   Savannah Carson N Savannah Carson 02/07/2017, 6:42 PM

## 2017-02-07 NOTE — Anesthesia Postprocedure Evaluation (Signed)
Anesthesia Post Note  Patient: Savannah CoyerBrianna N Carson  Procedure(s) Performed: AN AD HOC LABOR EPIDURAL  Patient location during evaluation: Mother Baby Anesthesia Type: Epidural Level of consciousness: awake and alert Pain management: pain level controlled Vital Signs Assessment: post-procedure vital signs reviewed and stable Respiratory status: spontaneous breathing, nonlabored ventilation and respiratory function stable Cardiovascular status: stable Postop Assessment: no headache, no backache and epidural receding Anesthetic complications: no     Last Vitals:  Vitals:   02/07/17 0458 02/07/17 0832  BP: 128/80 120/60  Pulse: 98 (!) 103  Resp: 20 17  Temp: 37 C 36.7 C  SpO2: 98% 98%    Last Pain:  Vitals:   02/07/17 1030  TempSrc:   PainSc: 0-No pain                 Mathews ArgyleLogan,  Surabhi Gadea P

## 2017-02-07 NOTE — Plan of Care (Signed)
To mother/baby at this time; tolerating regular diet; up with assistance; encourage bonding with baby

## 2017-02-07 NOTE — Clinical Social Work Maternal (Signed)
  CLINICAL SOCIAL WORK MATERNAL/CHILD NOTE  Patient Details  Name: Savannah Carson MRN: [id number] Date of Birth: [birth year]  Date:  02/07/2017  Clinical Social Worker Initiating Note:  York SpanielMonica Spring San MSW,LCSW Date/Time: Initiated:  02/07/17/      Child's Name:      Biological Parents:  Mother, Father   Need for Interpreter:  None   Reason for Referral:  Current Substance Use/Substance Use During Pregnancy    Address:  [address] [place] [address]    Phone number:  [phone] (home)     Additional phone number: none  Household Members/Support Persons (HM/SP):       HM/SP Name Relationship DOB or Age  HM/SP -1        HM/SP -2        HM/SP -3        HM/SP -4        HM/SP -5        HM/SP -6        HM/SP -7        HM/SP -8          Natural Supports (not living in the home):  Spouse/significant other   Professional Supports:     Employment: Environmental education officerull-time   Type of Work:     Education:      Homebound arranged:    Architectinancial Resources:  Media plannerrivate Insurance   Other [facility]  [facility]   Cultural/Religious Considerations Which May Impact Care:  none  Strengths:  Ability to meet basic needs , Home prepared for child    Psychotropic Medications:         Pediatrician:       [place]    [place]    [place]    [place]      Pediatrician Fax Number:    Risk Factors/Current Problems:  Substance Use    Cognitive State:  Able to Concentrate , Alert , Goal Oriented    Mood/Affect:  Relaxed , Happy , Calm    CSW Assessment: CSW spoke with patient and her boyfriend (father of baby) this morning. Patient gave permission to speak freely in front of father of baby as well as a visitor who came into the room mid-assessment. Patient states that in the home will be her parents and her siblings and grandmother. She states she has all necessities for her newborn and has no concerns  regarding transportation. Patient confirmed she has received education regarding postpartum depression during her prenatal care. Patient states she is employed but is currently on maternity leave but will be returning. Patient reports that she had been living with her boyfriend and that they smoke marijuana all the time. She states she smoked twice but cannot tell CSW when the last time was. CSW explained that cord tissue is pending and if that comes back positive for any illicit substances that DSS CPS will be contacted and a report will be made. She verbalized understanding.   CSW Plan/Description:  CSW Will Continue to Monitor Umbilical Cord Tissue Drug Screen Results and Make Report if [facility]    Charlene Detter, LCSW 02/07/2017, 11:01 AM

## 2017-02-08 LAB — RPR: RPR Ser Ql: NONREACTIVE

## 2017-02-08 NOTE — Progress Notes (Signed)
Post Partum Day 2 Subjective: no complaints and up ad lib  Objective: Blood pressure (!) 103/55, pulse 68, temperature 98.5 F (36.9 C), temperature source Oral, resp. rate 17, last menstrual period 05/05/2016, SpO2 99 %, unknown if currently breastfeeding.  Physical Exam:  General: alert, cooperative and appears stated age Lochia: appropriate Uterine Fundus: firm Incision: NA DVT Evaluation: No evidence of DVT seen on physical exam. Negative Homan's sign.  Recent Labs    02/06/17 1947 02/07/17 0600  HGB 12.0 11.2*  HCT 35.6 33.8*    Assessment/Plan: Plan for discharge tomorrow, infant has to stay for observation due to GBS Infant feeding  Bottle    LOS: 2 days   Melody N Shambley 02/08/2017, 8:52 AM

## 2017-02-09 ENCOUNTER — Encounter: Payer: Medicaid Other | Admitting: Certified Nurse Midwife

## 2017-02-09 NOTE — Discharge Summary (Signed)
L&D OB Triage Note  Savannah CoyerBrianna N Carson is a 21 y.o. G1P1001 female at 8193w5d, EDD Estimated Date of Delivery: 02/09/17 who presented to triage for complaints of decreased fetal movement today and irregular mild contractions.  She was evaluated by the nurses with no significant findings/findings significant for fetal concerns or labor. Vital signs stable. An NST was performed and has been reviewed by Me. She was reassured.   NST INTERPRETATION: Indications: decreased fetal movement and rule out uterine contractions  Mode: External Baseline Rate (A): 127 bpm Variability: Moderate Accelerations: 15 x 15 Decelerations: None     Contraction Frequency (min): 2-3  Impression: reactive   Plan: NST performed was reviewed and was found to be reactive. She was discharged home with bleeding/labor precautions.  Continue routine prenatal care. Follow up with OB/GYN as previously scheduled.     Melody Suzan NailerN Shambley, CNM

## 2017-02-09 NOTE — Progress Notes (Signed)
Discharge order received from doctor. Reviewed discharge instructions with patient and answered all questions. Follow up appointment instructions given. Patient verbalized understanding. ID bands checked. Patient discharged home with infant via wheelchair by nursing/auxillary.   Deral Schellenberg Garner, RN  

## 2017-02-09 NOTE — Discharge Instructions (Signed)
Please call your doctor or return to the ER if you experience any chest pains, shortness of breath, dizziness, visual changes, fever greater than 101, any heavy bleeding (saturating more than 1 pad per hour), large clots, or foul smelling discharge, any worsening abdominal pain and cramping that is not controlled by pain medication, or any signs of postpartum depression. No tampons, enemas, douches, or sexual intercourse for 6 weeks. Also avoid tub baths, hot tubs, or swimming for 6 weeks.  °

## 2017-02-12 ENCOUNTER — Telehealth: Payer: Self-pay | Admitting: Certified Nurse Midwife

## 2017-02-12 NOTE — Telephone Encounter (Signed)
The patient called and stated that she is experiencing swelling and heavy discharge of breast milk from er breasts, The patient stated that the milk is "pouring out" The patient also stated that she is experiencing some discomfort, Her breast are tender/Sore. The patient would like to have a prescription or something to take to relieve her issue. No other information was disclosed. Please advise.

## 2017-02-12 NOTE — Telephone Encounter (Signed)
pls advise

## 2017-02-13 NOTE — Discharge Summary (Signed)
Obstetric Discharge Summary Reason for Admission: onset of labor and rupture of membranes Prenatal Procedures: NST and ultrasound Intrapartum Procedures: spontaneous vaginal delivery and GBS prophylaxis Postpartum Procedures: none Complications-Operative and Postpartum: none  Delivery Note At 12:02 AM a viable and healthy female was delivered via Vaginal, Spontaneous (Presentation: ;  ).  APGAR: 8, 9; weight 7 lb 14.6 oz (3590 g).    Anesthesia:  none Episiotomy: None Lacerations:  none Suture Repair: NA Est. Blood Loss (mL):    Mom to postpartum.  Baby to Couplet care / Skin to Skin.  Melody N Shambley 02/13/2017, 2:33 PM  .     H/H:  Lab Results  Component Value Date/Time   HGB 11.2 (L) 02/07/2017 06:00 AM   HGB 12.1 11/17/2016 09:09 AM   HCT 33.8 (L) 02/07/2017 06:00 AM   HCT 36.6 11/17/2016 09:09 AM    Discharge Diagnoses: Term Pregnancy-delivered  Discharge Information: Date: 02/13/2017 Activity: pelvic rest Diet: routine Baby feeding: undecided Contraception: nexplanon Medications: PNV, Ibuprofen and Colace Condition: stable Instructions: refer to practice specific booklet Discharge to: home   Melody Shambley,CNM 02/13/2017,2:33 PM

## 2017-02-13 NOTE — Telephone Encounter (Signed)
Notified pt. 

## 2017-02-13 NOTE — Telephone Encounter (Signed)
This message should have went to [name]/midwife as she was on call and could have handled it that morning!  Either way, only thing she can do at this time is bendryl and tylenol and binding the breasts (if she is not going to breastfeed) and give it time.  Savannah Carson

## 2017-03-15 ENCOUNTER — Ambulatory Visit (INDEPENDENT_AMBULATORY_CARE_PROVIDER_SITE_OTHER): Payer: BC Managed Care – PPO | Admitting: Obstetrics and Gynecology

## 2017-03-15 ENCOUNTER — Encounter: Payer: Medicaid Other | Admitting: Obstetrics and Gynecology

## 2017-03-15 ENCOUNTER — Encounter: Payer: Self-pay | Admitting: Obstetrics and Gynecology

## 2017-03-15 VITALS — BP 98/61 | HR 79 | Ht 69.0 in | Wt 166.6 lb

## 2017-03-15 DIAGNOSIS — Z3042 Encounter for surveillance of injectable contraceptive: Secondary | ICD-10-CM

## 2017-03-15 MED ORDER — MEDROXYPROGESTERONE ACETATE 150 MG/ML IM SUSP: 150.0000 mg | INTRAMUSCULAR | 4 refills | Status: DC

## 2017-03-15 NOTE — Progress Notes (Signed)
Subjective:     Patient ID: Thurman CoyerBrianna N Robinson, female   DOB: [birth year], 22 y.o.   MRN: [id number]  [facility]. Desires Depo injection. Denies any concerns or complaints.  Review of Systems    negative Objective:   Physical Exam A&Ox4 Well groomed female in no distress Blood pressure 98/61, pulse 79, height 5\' 9"  (1.753 m), weight 166 lb 9.6 oz (75.6 kg), unknown if currently breastfeeding. PE not indicated    Assessment:     New start Depo    Plan:     First injection given, will return next week for PPV and then 12 weeks from today for next depo injection.  Juleen Sorrels,CNM

## 2017-03-15 NOTE — Patient Instructions (Signed)

## 2017-03-23 ENCOUNTER — Encounter: Payer: Self-pay | Admitting: Certified Nurse Midwife

## 2017-03-23 ENCOUNTER — Ambulatory Visit (INDEPENDENT_AMBULATORY_CARE_PROVIDER_SITE_OTHER): Payer: Medicaid Other | Admitting: Certified Nurse Midwife

## 2017-03-23 ENCOUNTER — Encounter: Payer: Medicaid Other | Admitting: Obstetrics and Gynecology

## 2017-03-23 NOTE — Progress Notes (Signed)
Subjective:    Savannah Carson is a 22 y.o. 651P1001 African American female who presents for a postpartum visit. She is 6 weeks postpartum following a spontaneous vaginal delivery at 39.4 gestational weeks. Anesthesia: epidural. I have fully reviewed the prenatal and intrapartum course. Postpartum course has been normal. Baby's course has been normal. Baby is feeding by bottle. Bleeding bleeding started agian with Depo injection. Bowel function is normal. Bladder function is normal. Patient is not sexually active. Last sexual activity: prior to delivery. Contraception method is Depo-Provera injections. Postpartum depression screening: negative. Score 4.  Last pap n/a.  The following portions of the patient's history were reviewed and updated as appropriate: allergies, current medications, past medical history, past surgical history and problem list.  Review of Systems Pertinent items are noted in HPI.   Vitals:   03/23/17 1053  Weight: 166 lb (75.3 kg)  Height: 5\' 9"  (1.753 m)   Patient's last menstrual period was 03/16/2017.  Objective:   General:  alert, cooperative and no distress   Breasts:  deferred, no complaints  Lungs: clear to auscultation bilaterally  Heart:  regular rate and rhythm  Abdomen: soft, nontender   Vulva: normal  Vagina: normal vagina  Cervix:  closed  Corpus: Well-involuted  Adnexa:  Non-palpable  Rectal Exam: No  hemorrhoids        Assessment:   Postpartum exam 6 wks s/p SVD bottlefeeding Depression screening-negative Contraception counseling -depo injection  Plan:  : Depo-Provera injections Follow up in: 6 months for annual exam or earlier if needed  Doreene BurkeAnnie Maddilynn Esperanza, CNM

## 2017-03-23 NOTE — Patient Instructions (Signed)
Preventive Care 18-39 Years, Female Preventive care refers to lifestyle choices and visits with your health care provider that can promote health and wellness. What does preventive care include?  A yearly physical exam. This is also called an annual well check.  Dental exams once or twice a year.  Routine eye exams. Ask your health care provider how often you should have your eyes checked.  Personal lifestyle choices, including: ? Daily care of your teeth and gums. ? Regular physical activity. ? Eating a healthy diet. ? Avoiding tobacco and drug use. ? Limiting alcohol use. ? Practicing safe sex. ? Taking vitamin and mineral supplements as recommended by your health care provider. What happens during an annual well check? The services and screenings done by your health care provider during your annual well check will depend on your age, overall health, lifestyle risk factors, and family history of disease. Counseling Your health care provider may ask you questions about your:  Alcohol use.  Tobacco use.  Drug use.  Emotional well-being.  Home and relationship well-being.  Sexual activity.  Eating habits.  Work and work Statistician.  Method of birth control.  Menstrual cycle.  Pregnancy history.  Screening You may have the following tests or measurements:  Height, weight, and BMI.  Diabetes screening. This is done by checking your blood sugar (glucose) after you have not eaten for a while (fasting).  Blood pressure.  Lipid and cholesterol levels. These may be checked every 5 years starting at age 66.  Skin check.  Hepatitis C blood test.  Hepatitis B blood test.  Sexually transmitted disease (STD) testing.  BRCA-related cancer screening. This may be done if you have a family history of breast, ovarian, tubal, or peritoneal cancers.  Pelvic exam and Pap test. This may be done every 3 years starting at age 40. Starting at age 59, this may be done every 5  years if you have a Pap test in combination with an HPV test.  Discuss your test results, treatment options, and if necessary, the need for more tests with your health care provider. Vaccines Your health care provider may recommend certain vaccines, such as:  Influenza vaccine. This is recommended every year.  Tetanus, diphtheria, and acellular pertussis (Tdap, Td) vaccine. You may need a Td booster every 10 years.  Varicella vaccine. You may need this if you have not been vaccinated.  HPV vaccine. If you are 69 or younger, you may need three doses over 6 months.  Measles, mumps, and rubella (MMR) vaccine. You may need at least one dose of MMR. You may also need a second dose.  Pneumococcal 13-valent conjugate (PCV13) vaccine. You may need this if you have certain conditions and were not previously vaccinated.  Pneumococcal polysaccharide (PPSV23) vaccine. You may need one or two doses if you smoke cigarettes or if you have certain conditions.  Meningococcal vaccine. One dose is recommended if you are age 27-21 years and a first-year college student living in a residence hall, or if you have one of several medical conditions. You may also need additional booster doses.  Hepatitis A vaccine. You may need this if you have certain conditions or if you travel or work in places where you may be exposed to hepatitis A.  Hepatitis B vaccine. You may need this if you have certain conditions or if you travel or work in places where you may be exposed to hepatitis B.  Haemophilus influenzae type b (Hib) vaccine. You may need this if  you have certain risk factors.  Talk to your health care provider about which screenings and vaccines you need and how often you need them. This information is not intended to replace advice given to you by your health care provider. Make sure you discuss any questions you have with your health care provider. Document Released: 04/25/2001 Document Revised: 11/17/2015  Document Reviewed: 12/29/2014 Elsevier Interactive Patient Education  [employer].

## 2017-03-29 ENCOUNTER — Telehealth: Payer: Self-pay | Admitting: *Deleted

## 2017-03-29 NOTE — Telephone Encounter (Signed)
Patient states she got her Depo injection on 03/15/17 and patient states she is still bleeding . Patient is wondering is that normal. She is requesitng a call back. Her contact is # [phone]. Please advise. Thank you

## 2017-03-30 ENCOUNTER — Telehealth: Payer: Self-pay | Admitting: Certified Nurse Midwife

## 2017-03-30 NOTE — Telephone Encounter (Signed)
Pt is 2 weeks s/p depo inj after delivery. She is still bleeding. Changing q 2-3h. No cramps. NO new sex partners. Advised pt to monitor. Pt aware it may take 2 inj to stop bleeding. Add MVI qd. Pt will call back if soaking a pad q 30 minutes to an hour or severe cramps.

## 2017-03-30 NOTE — Telephone Encounter (Signed)
The patient called and stated that she is experiencing heavy bleeding since having her depo injection, The patient is filling up a pad in 1-2hrs. The patient would like to speak with a nurse as soon as possible. The patient also stated that the 1st encounter was not helpful and she did not understand the first phone call. Please advise.

## 2017-03-30 NOTE — Telephone Encounter (Signed)
LMTRC

## 2017-04-02 NOTE — Telephone Encounter (Signed)
lmtrc

## 2017-04-03 NOTE — Telephone Encounter (Signed)
lmtrc

## 2017-04-04 NOTE — Telephone Encounter (Signed)
Apologized to pt for not making myself clear during the first telephone encounter. Pt states she is bleeding now- changing q 2 hours but it has gotten lighter. Offered to make an appt. Pt declines at this time. Bleeding precautions given.

## 2017-05-02 ENCOUNTER — Telehealth: Payer: Self-pay

## 2017-05-02 ENCOUNTER — Telehealth: Payer: Self-pay | Admitting: Certified Nurse Midwife

## 2017-05-02 NOTE — Telephone Encounter (Signed)
The patient called and stated that she is still experiencing bleeding after getting her depo injection, The patient stated that she is having anything from (Spotting, moderate to heavy bleeding with clots.) The patient disclosed that she is also experiencing cramping as well. The patient is concerned because she is having to wear a pad most of the time and she is also in a lot of discomfort. The patient would like a call back from a nurse to know if she needs to come in for an appointment or if she needs to change the type of B.C she is on. No other information was disclosed. Please advise.

## 2017-06-11 ENCOUNTER — Telehealth: Payer: Self-pay | Admitting: Obstetrics and Gynecology

## 2017-06-11 NOTE — Telephone Encounter (Signed)
Patient called stating she is bleeding very heavy, she is changing a pad every 30 minutes. She is on the depo shot and is scheduled to come in on 4/5. Please Advise.

## 2017-06-12 NOTE — Telephone Encounter (Signed)
Have her come in earlier to get it.

## 2017-06-12 NOTE — Telephone Encounter (Signed)
pls advise

## 2017-06-13 ENCOUNTER — Telehealth: Payer: Self-pay | Admitting: Obstetrics and Gynecology

## 2017-06-13 NOTE — Telephone Encounter (Signed)
The patient called and stated that she is still bleeding and would like to know if she needs to change her form of B.C. No other information was disclosed. PLease advise.

## 2017-06-13 NOTE — Telephone Encounter (Signed)
Advised pt she could go ahead and get depo early, voiced understanding

## 2017-06-15 ENCOUNTER — Ambulatory Visit: Payer: Medicaid Other

## 2018-01-19 ENCOUNTER — Emergency Department
Admission: EM | Admit: 2018-01-19 | Discharge: 2018-01-19 | Disposition: A | Discharge status: Home / Self Care | Payer: BC Managed Care – PPO | Attending: Emergency Medicine | Admitting: Emergency Medicine

## 2018-01-19 ENCOUNTER — Encounter: Payer: Self-pay | Admitting: Medical Oncology

## 2018-01-19 DIAGNOSIS — R002 Palpitations: Secondary | ICD-10-CM | POA: Diagnosis not present

## 2018-01-19 DIAGNOSIS — F410 Panic disorder [episodic paroxysmal anxiety] without agoraphobia: Secondary | ICD-10-CM | POA: Insufficient documentation

## 2018-01-19 DIAGNOSIS — F419 Anxiety disorder, unspecified: Secondary | ICD-10-CM | POA: Diagnosis present

## 2018-01-19 DIAGNOSIS — R0602 Shortness of breath: Secondary | ICD-10-CM | POA: Diagnosis not present

## 2018-01-19 DIAGNOSIS — J45909 Unspecified asthma, uncomplicated: Secondary | ICD-10-CM | POA: Insufficient documentation

## 2018-01-19 DIAGNOSIS — R42 Dizziness and giddiness: Secondary | ICD-10-CM | POA: Diagnosis not present

## 2018-01-19 DIAGNOSIS — R61 Generalized hyperhidrosis: Secondary | ICD-10-CM | POA: Insufficient documentation

## 2018-01-19 LAB — CBC WITH DIFFERENTIAL/PLATELET
ABS IMMATURE GRANULOCYTES: 0.01 10*3/uL (ref 0.00–0.07)
Basophils Absolute: 0 10*3/uL (ref 0.0–0.1)
Basophils Relative: 1 %
Eosinophils Absolute: 0.1 10*3/uL (ref 0.0–0.5)
Eosinophils Relative: 2 %
HCT: 44.7 % (ref 36.0–46.0)
HEMOGLOBIN: 15 g/dL (ref 12.0–15.0)
Immature Granulocytes: 0 %
LYMPHS ABS: 1.6 10*3/uL (ref 0.7–4.0)
LYMPHS PCT: 46 %
MCH: 27.5 pg (ref 26.0–34.0)
MCHC: 33.6 g/dL (ref 30.0–36.0)
MCV: 81.9 fL (ref 80.0–100.0)
MONO ABS: 0.3 10*3/uL (ref 0.1–1.0)
Monocytes Relative: 9 %
NEUTROS ABS: 1.4 10*3/uL — AB (ref 1.7–7.7)
Neutrophils Relative %: 42 %
Platelets: 337 10*3/uL (ref 150–400)
RBC: 5.46 MIL/uL — ABNORMAL HIGH (ref 3.87–5.11)
RDW: 13.9 % (ref 11.5–15.5)
WBC: 3.4 10*3/uL — ABNORMAL LOW (ref 4.0–10.5)
nRBC: 0 % (ref 0.0–0.2)

## 2018-01-19 LAB — TSH: TSH: 1.19 u[IU]/mL (ref 0.350–4.500)

## 2018-01-19 LAB — BASIC METABOLIC PANEL
Anion gap: 11 (ref 5–15)
BUN: 12 mg/dL (ref 6–20)
CO2: 24 mmol/L (ref 22–32)
Calcium: 9.5 mg/dL (ref 8.9–10.3)
Chloride: 104 mmol/L (ref 98–111)
Creatinine, Ser: 0.91 mg/dL (ref 0.44–1.00)
GFR calc Af Amer: >60 mL/min (ref >60)
GFR calc non Af Amer: >60 mL/min (ref >60)
GLUCOSE: 88 mg/dL (ref 70–99)
POTASSIUM: 3.6 mmol/L (ref 3.5–5.1)
Sodium: 139 mmol/L (ref 135–145)

## 2018-01-19 LAB — T4, FREE: Free T4: 1.16 ng/dL (ref 0.82–1.77)

## 2018-01-19 LAB — POCT PREGNANCY, URINE: PREG TEST UR: NEGATIVE

## 2018-01-19 LAB — FIBRIN DERIVATIVES D-DIMER (ARMC ONLY): Fibrin derivatives D-dimer (ARMC): 276.45 ng/mL (FEU) (ref 0.00–499.00)

## 2018-01-19 NOTE — Discharge Instructions (Addendum)
Your exam and evaluation overall reassuring in the emergency department today.  As we discussed, I think you are having symptoms of anxiety and panic attack, and I would recommend discussing with your primary care doctor recognition for counselor or therapist to continue working on methods to help "rewire" as we discussed.  You may want to look up "EFT" also called "tapping" examples.  Return to emergency department immediately for any worsening condition including trouble breathing, shortness breath, chest pain, dizziness or passing out, weakness, numbness, or any other symptoms concerning to you.

## 2018-01-19 NOTE — ED Triage Notes (Signed)
Pt reports for the past week she has been having anxiety, states that she wakes up at night in a panic. Pt seen her PCP yesterday for same and her MD wanted her to be placed on lexapro but pt doesn't want to take that d/t side effects.

## 2018-01-19 NOTE — ED Provider Notes (Signed)
[facility] Emergency Department Provider Note ____________________________________________   I have reviewed the triage vital signs and the triage nursing note.  HISTORY  Chief Complaint Anxiety   Historian Patient  HPI Savannah Carson is a 22 y.o. female with history of asthma, presents for evaluation of possible panic attack this morning.  States that she woke up from sleep with heart pounding, and then felt short of breath.  She states that she had feeling like she was going to die.  States that she thought it was probably panic attack.  No known inciting event.  She was sweating and feeling dizzy.  She has seen her doctor recently and they discussed anxiety and she was recommended for possible Lexapro, but states she did not want to be on medication.  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  She states that she often feels anxious and jittery when she goes to school or when she has to get her presentation.  No chest pain.  Episode lasted a few minutes.  No longer having any shortness of breath or sweating.  She feels a little bit anxious here.    Past Medical History:  Diagnosis Date  . Asthma     There are no active problems to display for this patient.   No past surgical history on file.  Prior to Admission medications   Medication Sig Start Date End Date Taking? Authorizing Provider  albuterol (PROVENTIL HFA;VENTOLIN HFA) 108 (90 Base) MCG/ACT inhaler Inhale into the lungs. 12/31/14   [provider]  medroxyPROGESTERone (DEPO-PROVERA) 150 MG/ML injection Inject 1 mL (150 mg total) into the muscle every 3 (three) months. 03/15/17   Shambley, Melody N, CNM  sucralfate (CARAFATE) 1 GM/10ML suspension Take 10 mLs (1 g total) by mouth 3 (three) times daily as needed. Patient not taking: Reported on 12/29/2016 12/05/16 12/05/17  Willy Eddy, MD    Allergies  Allergen Reactions  . Augmentin [Amoxicillin-Pot Clavulanate]  Hives  . Cephalosporins Hives  . Penicillins Rash  . Sulfamethoxazole-Trimethoprim Rash    No family history on file.  Social History Social History   Tobacco Use  . Smoking status: Never Smoker  . Smokeless tobacco: Never Used  Substance Use Topics  . Alcohol use: No  . Drug use: No    Review of Systems  Constitutional: Negative for fever. Eyes: Negative for visual changes. ENT: Negative for sore throat. Cardiovascular: Negative for chest pain.  Incision of chest pounding when she woke up this morning, now gone Respiratory: As per HPI for episode of shortness of breath when she was feeling panicky upon waking, currently no shortness of breath.   Gastrointestinal: Negative for abdominal pain, vomiting and diarrhea. Genitourinary: Negative for dysuria. Musculoskeletal: Negative for back pain. Skin: Negative for rash. Neurological: Negative for headache.  ____________________________________________   PHYSICAL EXAM:  VITAL SIGNS: ED Triage Vitals  Enc Vitals Group     BP 01/19/18 0718 (!) 151/86     Pulse Rate 01/19/18 0718 90     Resp 01/19/18 0718 18     Temp 01/19/18 0718 98 F (36.7 C)     Temp Source 01/19/18 0718 Oral     SpO2 01/19/18 0718 99 %     Weight 01/19/18 0719 153 lb (69.4 kg)     Height 01/19/18 0719 5\' 9"  (1.753 m)     Head Circumference --      Peak Flow --      Pain Score 01/19/18 0719 5  Pain Loc --      Pain Edu? --      Excl. in GC? --      Constitutional: Alert and oriented.  HEENT      Head: Normocephalic and atraumatic.      Eyes: Conjunctivae are normal. Pupils equal and round.       Ears:         Nose: No congestion/rhinnorhea.      Mouth/Throat: Mucous membranes are moist.      Neck: No stridor. Cardiovascular/Chest: Normal rate, regular rhythm.  No murmurs, rubs, or gallops. Respiratory: Normal respiratory effort without tachypnea nor retractions. Breath sounds are clear and equal bilaterally. No  wheezes/rales/rhonchi. Gastrointestinal: Soft. No distention, no guarding, no rebound. Nontender.    Genitourinary/rectal:Deferred Musculoskeletal: Nontender with normal range of motion in all extremities. No joint effusions.  No lower extremity tenderness.  No edema. Neurologic:  Normal speech and language. No gross or focal neurologic deficits are appreciated. Skin:  Skin is warm, dry and intact. No rash noted. Psychiatric: Mood and affect are normal. Speech and behavior are normal. Patient exhibits appropriate insight and judgment.   ____________________________________________  LABS (pertinent positives/negatives) I, Governor Rooks, MD the attending physician have reviewed the labs noted below.  Labs Reviewed  CBC WITH DIFFERENTIAL/PLATELET - Abnormal; Notable for the following components:      Result Value   WBC 3.4 (*)    RBC 5.46 (*)    Neutro Abs 1.4 (*)    All other components within normal limits  BASIC METABOLIC PANEL  FIBRIN DERIVATIVES D-DIMER ([facility] ONLY)  TSH  T4, FREE  POCT PREGNANCY, URINE    ____________________________________________    EKG I, Governor Rooks, MD, the attending physician have personally viewed and interpreted all ECGs.  84 bpm.  Normal sinus rhythm.  Narrow QRS.  Normal axis.  Normal ST and T wave.  No evidence of Brugada or Wolff-Parkinson-White ____________________________________________  RADIOLOGY   None __________________________________________  PROCEDURES  Procedure(s) performed: None  Procedures  Critical Care performed: None   ____________________________________________  ED COURSE / ASSESSMENT AND PLAN  Pertinent labs & imaging results that were available during my care of the patient were reviewed by me and considered in my medical decision making (see chart for details).    Based on patient's description certainly sounds most likely clinically consistent with panic attack this morning.  We discussed checking  screening labs it does not sound like this is been done in terms of electrolytes, anemia, thyroid panel.  EKG is reassuring.  Low suspicion for PE, d-dimer sent.  We spent a long period of time discussing anxiety and some cognitive behavioral techniques.  We discussed possible counselor/therapist and then re-discussed with a primary care doctor if need for any medication which she still would like to hold off on.   Discussed with patient and her dad, labs are all reassuring.  Shortness of breath episode was at the time of the panic attack, she has no shortness of breath now, and history or current cough, and no history or current chest pain, normal lung exam, no fevers, and I do not think an x-ray is warranted at this point time.  Okay for discharge home today.    CONSULTATIONS: None   Patient / Family / Caregiver informed of clinical course, medical decision-making process, and agree with plan.   I discussed return precautions, follow-up instructions, and discharge instructions with patient and/or family.  Discharge Instructions : Your exam and evaluation overall reassuring in  the emergency department today.  As we discussed, I think you are having symptoms of anxiety and panic attack, and I would recommend discussing with your primary care doctor recognition for counselor or therapist to continue working on methods to help "rewire" as we discussed.  You may want to look up "EFT" also called "tapping" examples.  Return to emergency department immediately for any worsening condition including trouble breathing, shortness breath, chest pain, dizziness or passing out, weakness, numbness, or any other symptoms concerning to you.    ___________________________________________   FINAL CLINICAL IMPRESSION(S) / ED DIAGNOSES   Final diagnoses:  Panic attack      ___________________________________________         Note: This dictation was prepared with Dragon dictation. Any  transcriptional errors that result from this process are unintentional    Governor Rooks, MD 01/19/18 [phone]

## 2018-01-20 ENCOUNTER — Encounter: Payer: Self-pay | Admitting: Emergency Medicine

## 2018-01-20 ENCOUNTER — Emergency Department: Payer: BC Managed Care – PPO

## 2018-01-20 ENCOUNTER — Other Ambulatory Visit: Payer: Self-pay

## 2018-01-20 ENCOUNTER — Emergency Department
Admission: EM | Admit: 2018-01-20 | Discharge: 2018-01-20 | Disposition: A | Discharge status: Home / Self Care | Payer: BC Managed Care – PPO | Attending: Emergency Medicine | Admitting: Emergency Medicine

## 2018-01-20 DIAGNOSIS — R002 Palpitations: Secondary | ICD-10-CM | POA: Insufficient documentation

## 2018-01-20 DIAGNOSIS — R0602 Shortness of breath: Secondary | ICD-10-CM | POA: Diagnosis not present

## 2018-01-20 DIAGNOSIS — R0600 Dyspnea, unspecified: Secondary | ICD-10-CM | POA: Insufficient documentation

## 2018-01-20 DIAGNOSIS — J45909 Unspecified asthma, uncomplicated: Secondary | ICD-10-CM | POA: Diagnosis not present

## 2018-01-20 DIAGNOSIS — Z79899 Other long term (current) drug therapy: Secondary | ICD-10-CM | POA: Diagnosis not present

## 2018-01-20 DIAGNOSIS — R0789 Other chest pain: Secondary | ICD-10-CM | POA: Diagnosis present

## 2018-01-20 LAB — TROPONIN I: Troponin I: <0.03 ng/mL (ref <0.03)

## 2018-01-20 MED ORDER — LORAZEPAM 1 MG PO TABS: 1.0000 mg | ORAL_TABLET | Freq: Four times a day (QID) | ORAL | 0 refills | Status: AC | PRN

## 2018-01-20 MED ORDER — LORAZEPAM 1 MG PO TABS
1.0000 mg | ORAL_TABLET | Freq: Once | ORAL | Status: AC
  Administered 2018-01-20: 1 mg via ORAL
  Filled 2018-01-20: qty 1

## 2018-01-20 MED ORDER — SODIUM CHLORIDE 0.9 % IV BOLUS
1000.0000 mL | Freq: Once | INTRAVENOUS | Status: AC
  Administered 2018-01-20: 1000 mL via INTRAVENOUS

## 2018-01-20 NOTE — ED Provider Notes (Addendum)
[facility] Emergency Department Provider Note  ___________________________________________   First MD Initiated Contact with Patient 01/20/18 [phone]     (approximate)  I have reviewed the triage vital signs and the nursing notes.   HISTORY  Chief Complaint Panic Attack   HPI Savannah Carson is a 22 y.o. female a history of asthma who has had recent "panic attacks."  She is presented to the emergency department today because she has persistent "pounding in her chest" as well as tightness in her chest and a feeling that she will pass out.  Says that she feels very anxious but has not had any significant life change recently that she reports feeling anxious about.  She saw her primary care doctor this past Friday who recommended a half tab of Lexapro increasing to a full tab after 1 week.  Also was here in the emergency department yesterday where she had a negative d-dimer as well as reassuring CBC as well as BMP.  However, patient says that the symptoms have persisted and so she has presented back to the emergency department at this time.  She reports that she has been compliant with her Lexapro.  Past Medical History:  Diagnosis Date  . Asthma     There are no active problems to display for this patient.   History reviewed. No pertinent surgical history.  Prior to Admission medications   Medication Sig Start Date End Date Taking? Authorizing Provider  albuterol (PROVENTIL HFA;VENTOLIN HFA) 108 (90 Base) MCG/ACT inhaler Inhale 2 puffs into the lungs every 4 (four) hours as needed for wheezing or shortness of breath.    Yes [provider]  escitalopram (LEXAPRO) 10 MG tablet Take 5-10 mg by mouth See admin instructions. Take  tablet (5MG ) by mouth every day for 6 days then take 1 tablet (10MG ) by mouth daily 01/18/18  Yes [provider]  medroxyPROGESTERone (DEPO-PROVERA) 150 MG/ML injection Inject 1 mL (150 mg total) into the muscle every 3  (three) months. 03/15/17  Yes Shambley, Melody N, CNM  Prenatal Vit-Fe Fumarate-FA (PRENATAL MULTIVITAMIN) TABS tablet Take 1 tablet by mouth daily at 12 noon.   Yes [provider]    Allergies Augmentin [amoxicillin-pot clavulanate]; Cefprozil; Cephalosporins; Penicillins; and Sulfamethoxazole-trimethoprim  No family history on file.  Social History Social History   Tobacco Use  . Smoking status: Never Smoker  . Smokeless tobacco: Never Used  Substance Use Topics  . Alcohol use: No  . Drug use: No    Review of Systems  Constitutional: No fever/chills Eyes: No visual changes. ENT: No sore throat. Cardiovascular: As above Respiratory: As above Gastrointestinal: No abdominal pain.  No nausea, no vomiting.  No diarrhea.  No constipation. Genitourinary: Negative for dysuria. Musculoskeletal: Negative for back pain. Skin: Negative for rash. Neurological: Negative for headaches, focal weakness or numbness.   ____________________________________________   PHYSICAL EXAM:  VITAL SIGNS: ED Triage Vitals  Enc Vitals Group     BP 01/20/18 0940 133/79     Pulse Rate 01/20/18 0940 92     Resp 01/20/18 0940 18     Temp --      Temp src --      SpO2 01/20/18 0940 97 %     Weight 01/20/18 0842 153 lb (69.4 kg)     Height --      Head Circumference --      Peak Flow --      Pain Score 01/20/18 0841 5  Pain Loc --      Pain Edu? --      Excl. in GC? --     Constitutional: Alert and oriented.  in no acute distress.  Slightly diaphoretic. Eyes: Conjunctivae are normal.  Head: Atraumatic. Nose: No congestion/rhinnorhea. Mouth/Throat: Mucous membranes are moist.  Neck: No stridor.   Cardiovascular: Tachycardic, regular rhythm. Grossly normal heart sounds.   Respiratory: Normal respiratory effort.  No retractions. Lungs CTAB. Gastrointestinal: Soft and nontender. No distention. No CVA tenderness. Musculoskeletal: No lower extremity tenderness nor edema.  No joint  effusions. Neurologic:  Normal speech and language. No gross focal neurologic deficits are appreciated. Skin:  Skin is warm, dry and intact. No rash noted. Psychiatric: Mood and affect are normal. Speech and behavior are normal.  ____________________________________________   LABS (all labs ordered are listed, but only abnormal results are displayed)  Labs Reviewed  TROPONIN I   ____________________________________________  EKG  ED ECG REPORT I, Arelia Longest, the attending physician, personally viewed and interpreted this ECG.   Date: 01/20/2018  EKG Time: 0851  Rate: 119  Rhythm: sinus tachycardia  Axis: Normal  Intervals:none  ST&T Change: No ST segment elevation or depression.  No abnormal T wave inversion.  ____________________________________________  RADIOLOGY  Chest x-ray without acute process ____________________________________________   PROCEDURES  Procedure(s) performed:   Procedures  Critical Care performed:   ____________________________________________   INITIAL IMPRESSION / ASSESSMENT AND PLAN / ED COURSE  Pertinent labs & imaging results that were available during my care of the patient were reviewed by me and considered in my medical decision making (see chart for details).  Differential diagnosis includes, but is not limited to, ACS, aortic dissection, pulmonary embolism, cardiac tamponade, pneumothorax, pneumonia, pericarditis, myocarditis, GI-related causes including esophagitis/gastritis, and musculoskeletal chest wall pain.   As part of my medical decision making, I reviewed the following data within the electronic MEDICAL RECORD NUMBER Notes from prior ED visits  ----------------------------------------- 11:59 AM on 01/20/2018 -----------------------------------------  Patient at this time after Ativan says that her symptoms have improved slightly but that she still feels her heart racing occasionally and feels difficulty with  breathing which is persistent.  Despite this, the patient is in no acute distress.  His breathing 18 to 19 breaths/min and has a heart rate of 94 bpm on the monitor at the bedside.  During her interview the patient's heart rate occasionally will increase up to 114 but will come back down to the 90s.  Patient with a negative troponin as well as chest x-ray without acute pathology.  Negative d-dimer yesterday with reassuring electrolytes as well as hemoglobin.  Patient to continue with her Lexapro and increase the dose as advised.  I will also be discharging her with Ativan as needed.  Patient and godmother, Misty Stanley, who is also 1 of our unit clerks, do not report anyone in the family dying suddenly of cardiac causes at young ages.  I believe the patient is sufficiently low risk to have this followed as an outpatient.  Were to be discharged at this time.  I also re-auscultated the patient's lungs and she is clear in all fields. ____________________________________________   FINAL CLINICAL IMPRESSION(S) / ED DIAGNOSES  Palpitations.  Shortness of breath.  NEW MEDICATIONS STARTED DURING THIS VISIT:  New Prescriptions   No medications on file     Note:  This document was prepared using Dragon voice recognition software and may include unintentional dictation errors.     Schaevitz, Myra Rude, MD  01/20/18 1203    Schaevitz, Myra Rude, MD 01/20/18 1205

## 2018-01-20 NOTE — ED Notes (Addendum)
Pt stable at this time. Pt seems to be having a lot of anxiety, saying she can't breathe. Oxygen sat is 99% on room air at this time. Pt has escitalopram prescribed and took half a pill last night while EMS was at her house and stated that it did not help. Hx of asthma, states not using inhaler bc told not to but no wheezing heard at this time

## 2018-01-20 NOTE — ED Triage Notes (Signed)
C/O feeling anxious since yesterday.  States was seen through ED yesterday for same, but symptoms persist

## 2018-01-20 NOTE — ED Notes (Signed)
Pt up to restroom.

## 2018-01-20 NOTE — ED Notes (Signed)
Patient transported to X-ray 

## 2018-02-20 ENCOUNTER — Ambulatory Visit (INDEPENDENT_AMBULATORY_CARE_PROVIDER_SITE_OTHER): Payer: BC Managed Care – PPO | Admitting: Obstetrics and Gynecology

## 2018-02-20 ENCOUNTER — Encounter: Payer: Self-pay | Admitting: Obstetrics and Gynecology

## 2018-02-20 VITALS — BP 117/61 | HR 64 | Ht 69.0 in | Wt 151.8 lb

## 2018-02-20 DIAGNOSIS — N921 Excessive and frequent menstruation with irregular cycle: Secondary | ICD-10-CM

## 2018-02-20 DIAGNOSIS — Z202 Contact with and (suspected) exposure to infections with a predominantly sexual mode of transmission: Secondary | ICD-10-CM

## 2018-02-20 DIAGNOSIS — F419 Anxiety disorder, unspecified: Secondary | ICD-10-CM | POA: Diagnosis not present

## 2018-02-20 DIAGNOSIS — Z01419 Encounter for gynecological examination (general) (routine) without abnormal findings: Secondary | ICD-10-CM

## 2018-02-20 MED ORDER — SERTRALINE HCL 25 MG PO TABS: 25.0000 mg | ORAL_TABLET | Freq: Every day | ORAL | 6 refills | Status: DC

## 2018-02-20 NOTE — Patient Instructions (Addendum)
Preventive Care 18-39 Years, Female Preventive care refers to lifestyle choices and visits with your health care provider that can promote health and wellness. What does preventive care include?  A yearly physical exam. This is also called an annual well check.  Dental exams once or twice a year.  Routine eye exams. Ask your health care provider how often you should have your eyes checked.  Personal lifestyle choices, including: ? Daily care of your teeth and gums. ? Regular physical activity. ? Eating a healthy diet. ? Avoiding tobacco and drug use. ? Limiting alcohol use. ? Practicing safe sex. ? Taking vitamin and mineral supplements as recommended by your health care provider. What happens during an annual well check? The services and screenings done by your health care provider during your annual well check will depend on your age, overall health, lifestyle risk factors, and family history of disease. Counseling Your health care provider may ask you questions about your:  Alcohol use.  Tobacco use.  Drug use.  Emotional well-being.  Home and relationship well-being.  Sexual activity.  Eating habits.  Work and work Statistician.  Method of birth control.  Menstrual cycle.  Pregnancy history.  Screening You may have the following tests or measurements:  Height, weight, and BMI.  Diabetes screening. This is done by checking your blood sugar (glucose) after you have not eaten for a while (fasting).  Blood pressure.  Lipid and cholesterol levels. These may be checked every 5 years starting at age 20.  Skin check.  Hepatitis C blood test.  Hepatitis B blood test.  Sexually transmitted disease (STD) testing.  BRCA-related cancer screening. This may be done if you have a family history of breast, ovarian, tubal, or peritoneal cancers.  Pelvic exam and Pap test. This may be done every 3 years starting at age 20. Starting at age 30, this may be done  every 5 years if you have a Pap test in combination with an HPV test.  Discuss your test results, treatment options, and if necessary, the need for more tests with your health care provider. Vaccines Your health care provider may recommend certain vaccines, such as:  Influenza vaccine. This is recommended every year.  Tetanus, diphtheria, and acellular pertussis (Tdap, Td) vaccine. You may need a Td booster every 10 years.  Varicella vaccine. You may need this if you have not been vaccinated.  HPV vaccine. If you are 39 or younger, you may need three doses over 6 months.  Measles, mumps, and rubella (MMR) vaccine. You may need at least one dose of MMR. You may also need a second dose.  Pneumococcal 13-valent conjugate (PCV13) vaccine. You may need this if you have certain conditions and were not previously vaccinated.  Pneumococcal polysaccharide (PPSV23) vaccine. You may need one or two doses if you smoke cigarettes or if you have certain conditions.  Meningococcal vaccine. One dose is recommended if you are age 20-21 years and a first-year college student living in a residence hall, or if you have one of several medical conditions. You may also need additional booster doses.  Hepatitis A vaccine. You may need this if you have certain conditions or if you travel or work in places where you may be exposed to hepatitis A.  Hepatitis B vaccine. You may need this if you have certain conditions or if you travel or work in places where you may be exposed to hepatitis B.  Haemophilus influenzae type b (Hib) vaccine. You may need this  if you have certain risk factors.  Talk to your health care provider about which screenings and vaccines you need and how often you need them. This information is not intended to replace advice given to you by your health care provider. Make sure you discuss any questions you have with your health care provider. Document Released: 04/25/2001 Document Revised:  11/17/2015 Document Reviewed: 12/29/2014 Elsevier Interactive Patient Education  2018 Elsevier Inc.    Living With Anxiety After being diagnosed with an anxiety disorder, you may be relieved to know why you have felt or behaved a certain way. It is natural to also feel overwhelmed about the treatment ahead and what it will mean for your life. With care and support, you can manage this condition and recover from it. How to cope with anxiety Dealing with stress Stress is your body's reaction to life changes and events, both good and bad. Stress can last just a few hours or it can be ongoing. Stress can play a major role in anxiety, so it is important to learn both how to cope with stress and how to think about it differently. Talk with your health care provider or a counselor to learn more about stress reduction. He or she may suggest some stress reduction techniques, such as:  Music therapy. This can include creating or listening to music that you enjoy and that inspires you.  Mindfulness-based meditation. This involves being aware of your normal breaths, rather than trying to control your breathing. It can be done while sitting or walking.  Centering prayer. This is a kind of meditation that involves focusing on a word, phrase, or sacred image that is meaningful to you and that brings you peace.  Deep breathing. To do this, expand your stomach and inhale slowly through your nose. Hold your breath for 3-5 seconds. Then exhale slowly, allowing your stomach muscles to relax.  Self-talk. This is a skill where you identify thought patterns that lead to anxiety reactions and correct those thoughts.  Muscle relaxation. This involves tensing muscles then relaxing them.  Choose a stress reduction technique that fits your lifestyle and personality. Stress reduction techniques take time and practice. Set aside 5-15 minutes a day to do them. Therapists can offer training in these techniques. The  training may be covered by some insurance plans. Other things you can do to manage stress include:  Keeping a stress diary. This can help you learn what triggers your stress and ways to control your response.  Thinking about how you respond to certain situations. You may not be able to control everything, but you can control your reaction.  Making time for activities that help you relax, and not feeling guilty about spending your time in this way.  Therapy combined with coping and stress-reduction skills provides the best chance for successful treatment. Medicines Medicines can help ease symptoms. Medicines for anxiety include:  Anti-anxiety drugs.  Antidepressants.  Beta-blockers.  Medicines may be used as the main treatment for anxiety disorder, along with therapy, or if other treatments are not working. Medicines should be prescribed by a health care provider. Relationships Relationships can play a big part in helping you recover. Try to spend more time connecting with trusted friends and family members. Consider going to couples counseling, taking family education classes, or going to family therapy. Therapy can help you and others better understand the condition. How to recognize changes in your condition Everyone has a different response to treatment for anxiety. Recovery from anxiety happens   when symptoms decrease and stop interfering with your daily activities at home or work. This may mean that you will start to:  Have better concentration and focus.  Sleep better.  Be less irritable.  Have more energy.  Have improved memory.  It is important to recognize when your condition is getting worse. Contact your health care provider if your symptoms interfere with home or work and you do not feel like your condition is improving. Where to find help and support: You can get help and support from these sources:  Self-help groups.  Online and community organizations.  A  trusted spiritual leader.  Couples counseling.  Family education classes.  Family therapy.  Follow these instructions at home:  Eat a healthy diet that includes plenty of vegetables, fruits, whole grains, low-fat dairy products, and lean protein. Do not eat a lot of foods that are high in solid fats, added sugars, or salt.  Exercise. Most adults should do the following: ? Exercise for at least 150 minutes each week. The exercise should increase your heart rate and make you sweat (moderate-intensity exercise). ? Strengthening exercises at least twice a week.  Cut down on caffeine, tobacco, alcohol, and other potentially harmful substances.  Get the right amount and quality of sleep. Most adults need 7-9 hours of sleep each night.  Make choices that simplify your life.  Take over-the-counter and prescription medicines only as told by your health care provider.  Avoid caffeine, alcohol, and certain over-the-counter cold medicines. These may make you feel worse. Ask your pharmacist which medicines to avoid.  Keep all follow-up visits as told by your health care provider. This is important. Questions to ask your health care provider  Would I benefit from therapy?  How often should I follow up with a health care provider?  How long do I need to take medicine?  Are there any long-term side effects of my medicine?  Are there any alternatives to taking medicine? Contact a health care provider if:  You have a hard time staying focused or finishing daily tasks.  You spend many hours a day feeling worried about everyday life.  You become exhausted by worry.  You start to have headaches, feel tense, or have nausea.  You urinate more than normal.  You have diarrhea. Get help right away if:  You have a racing heart and shortness of breath.  You have thoughts of hurting yourself or others. If you ever feel like you may hurt yourself or others, or have thoughts about taking  your own life, get help right away. You can go to your nearest emergency department or call:  Your local emergency services (911 in the [place]  A suicide crisis helpline, such as the National Suicide Prevention Lifeline at 1-800-273-8255. This is open 24-hours a day.  Summary  Taking steps to deal with stress can help calm you.  Medicines cannot cure anxiety disorders, but they can help ease symptoms.  Family, friends, and partners can play a big part in helping you recover from an anxiety disorder. This information is not intended to replace advice given to you by your health care provider. Make sure you discuss any questions you have with your health care provider. Document Released: 02/22/2016 Document Revised: 02/22/2016 Document Reviewed: 02/22/2016 Elsevier Interactive Patient Education  2018 Elsevier Inc.  

## 2018-02-20 NOTE — Progress Notes (Signed)
Subjective:     Savannah Carson is a single african [place]   22 y.o. female and is here for a comprehensive physical exam.FT STUDENT AND SOCIAL ANXIETY.  The patient reports problems - beeing seen and treated for panic attacks in ED. states lexapro isn't working and can't take whole dose due to side effects. .please see ED notes. Also reports some form of daily spotting since starting depo, and is due for 4th injection in January.  Social History   Socioeconomic History  . Marital status: Single    Spouse name: Not on file  . Number of children: Not on file  . Years of education: Not on file  . Highest education level: Not on file  Occupational History  . Not on file  Social Needs  . Financial resource strain: Not on file  . Food insecurity:    Worry: Not on file    Inability: Not on file  . Transportation needs:    Medical: Not on file    Non-medical: Not on file  Tobacco Use  . Smoking status: Never Smoker  . Smokeless tobacco: Never Used  Substance and Sexual Activity  . Alcohol use: No  . Drug use: No  . Sexual activity: Yes    Birth control/protection: None, Injection  Lifestyle  . Physical activity:    Days per week: Not on file    Minutes per session: Not on file  . Stress: Not on file  Relationships  . Social connections:    Talks on phone: Not on file    Gets together: Not on file    Attends religious service: Not on file    Active member of club or organization: Not on file    Attends meetings of clubs or organizations: Not on file    Relationship status: Not on file  . Intimate partner violence:    Fear of current or ex partner: Not on file    Emotionally abused: Not on file    Physically abused: Not on file    Forced sexual activity: Not on file  Other Topics Concern  . Not on file  Social History Narrative  . Not on file   Health Maintenance  Topic Date Due  . CHLAMYDIA SCREENING  10/20/2012  . INFLUENZA VACCINE  03/02/2019 (Originally  10/11/2017)  . TETANUS/TDAP  11/18/2026  . HIV Screening  Completed    The following portions of the patient's history were reviewed and updated as appropriate: allergies, current medications, past family history, past medical history, past social history, past surgical history and problem list.  Review of Systems Pertinent items noted in HPI and remainder of comprehensive ROS otherwise negative.   Objective:    General appearance: alert, cooperative and appears stated age Neck: no adenopathy, no carotid bruit, no JVD, supple, symmetrical, trachea midline and thyroid not enlarged, symmetric, no tenderness/mass/nodules Lungs: clear to auscultation bilaterally Breasts: declined Heart: regular rate and rhythm, S1, S2 normal, no murmur, click, rub or gallop Abdomen: soft, non-tender; bowel sounds normal; no masses,  no organomegaly Pelvic: cervix normal in appearance, external genitalia normal, no adnexal masses or tenderness, no cervical motion tenderness, rectovaginal septum normal, uterus normal size, shape, and consistency, vagina normal without discharge and wetprep normal except RBCs    Assessment:    Healthy female exam.  BTB on Depo anxiety     Plan:  Counseled on normal findings and treatment options for BTB, desires one month trial of OCPs and sample LoLoEstrin given to  start today.  Will switch to zoloft 25mg  for anxiety in place of lexapro and to keep scheduled follow up appointment with Dr Madelin RearBaboff in 2 weeks. RTC 1 year or as needed.    Rachael Zapanta,CNM    See After Visit Summary for Counseling Recommendations

## 2018-02-21 NOTE — Addendum Note (Signed)
Addended by: Brooke DareSICK, Lawerance Matsuo L on: 02/21/2018 04:12 PM   Modules accepted: Orders

## 2018-02-25 LAB — GC/CHLAMYDIA PROBE AMP
Chlamydia trachomatis, NAA: NEGATIVE
Neisseria gonorrhoeae by PCR: NEGATIVE

## 2018-04-30 ENCOUNTER — Other Ambulatory Visit: Payer: Self-pay | Admitting: Obstetrics and Gynecology

## 2018-05-06 ENCOUNTER — Other Ambulatory Visit: Payer: Self-pay | Admitting: *Deleted

## 2018-05-06 MED ORDER — MEDROXYPROGESTERONE ACETATE 150 MG/ML IM SUSY: 1.0000 mL | PREFILLED_SYRINGE | Freq: Once | INTRAMUSCULAR | 2 refills | Status: DC

## 2018-09-04 ENCOUNTER — Telehealth: Payer: Self-pay | Admitting: Obstetrics and Gynecology

## 2018-09-04 ENCOUNTER — Other Ambulatory Visit: Payer: Self-pay | Admitting: Obstetrics and Gynecology

## 2018-09-04 NOTE — Telephone Encounter (Signed)
The patients mother called and stated that the patient needs a refill of her depo injection sent into her pharmacy. The pt's mother is also requesting that more than one refill be called in so she does not haven to call as often for a refill. Please advise.

## 2018-09-05 NOTE — Telephone Encounter (Signed)
Done-ac 

## 2018-12-03 ENCOUNTER — Other Ambulatory Visit: Payer: Self-pay | Admitting: Obstetrics and Gynecology

## 2018-12-03 ENCOUNTER — Telehealth: Payer: Self-pay | Admitting: Obstetrics and Gynecology

## 2018-12-03 NOTE — Telephone Encounter (Signed)
The patients mother called and stated that the patient that the patient needs a refill of her depo injection sent into her pharmacy. Patients preferred pharmacy is [facility] on [address]. Please advise.

## 2018-12-03 NOTE — Telephone Encounter (Signed)
Done-ac 

## 2018-12-06 IMAGING — CR DG CHEST 2V
2 series · 2 of 2 positions shown · non-contrast
Comparison: None.

CLINICAL DATA: Shortness of breath. Anxiety. Left chest pain.

EXAM:
CHEST - 2 VIEW

[chest pa]
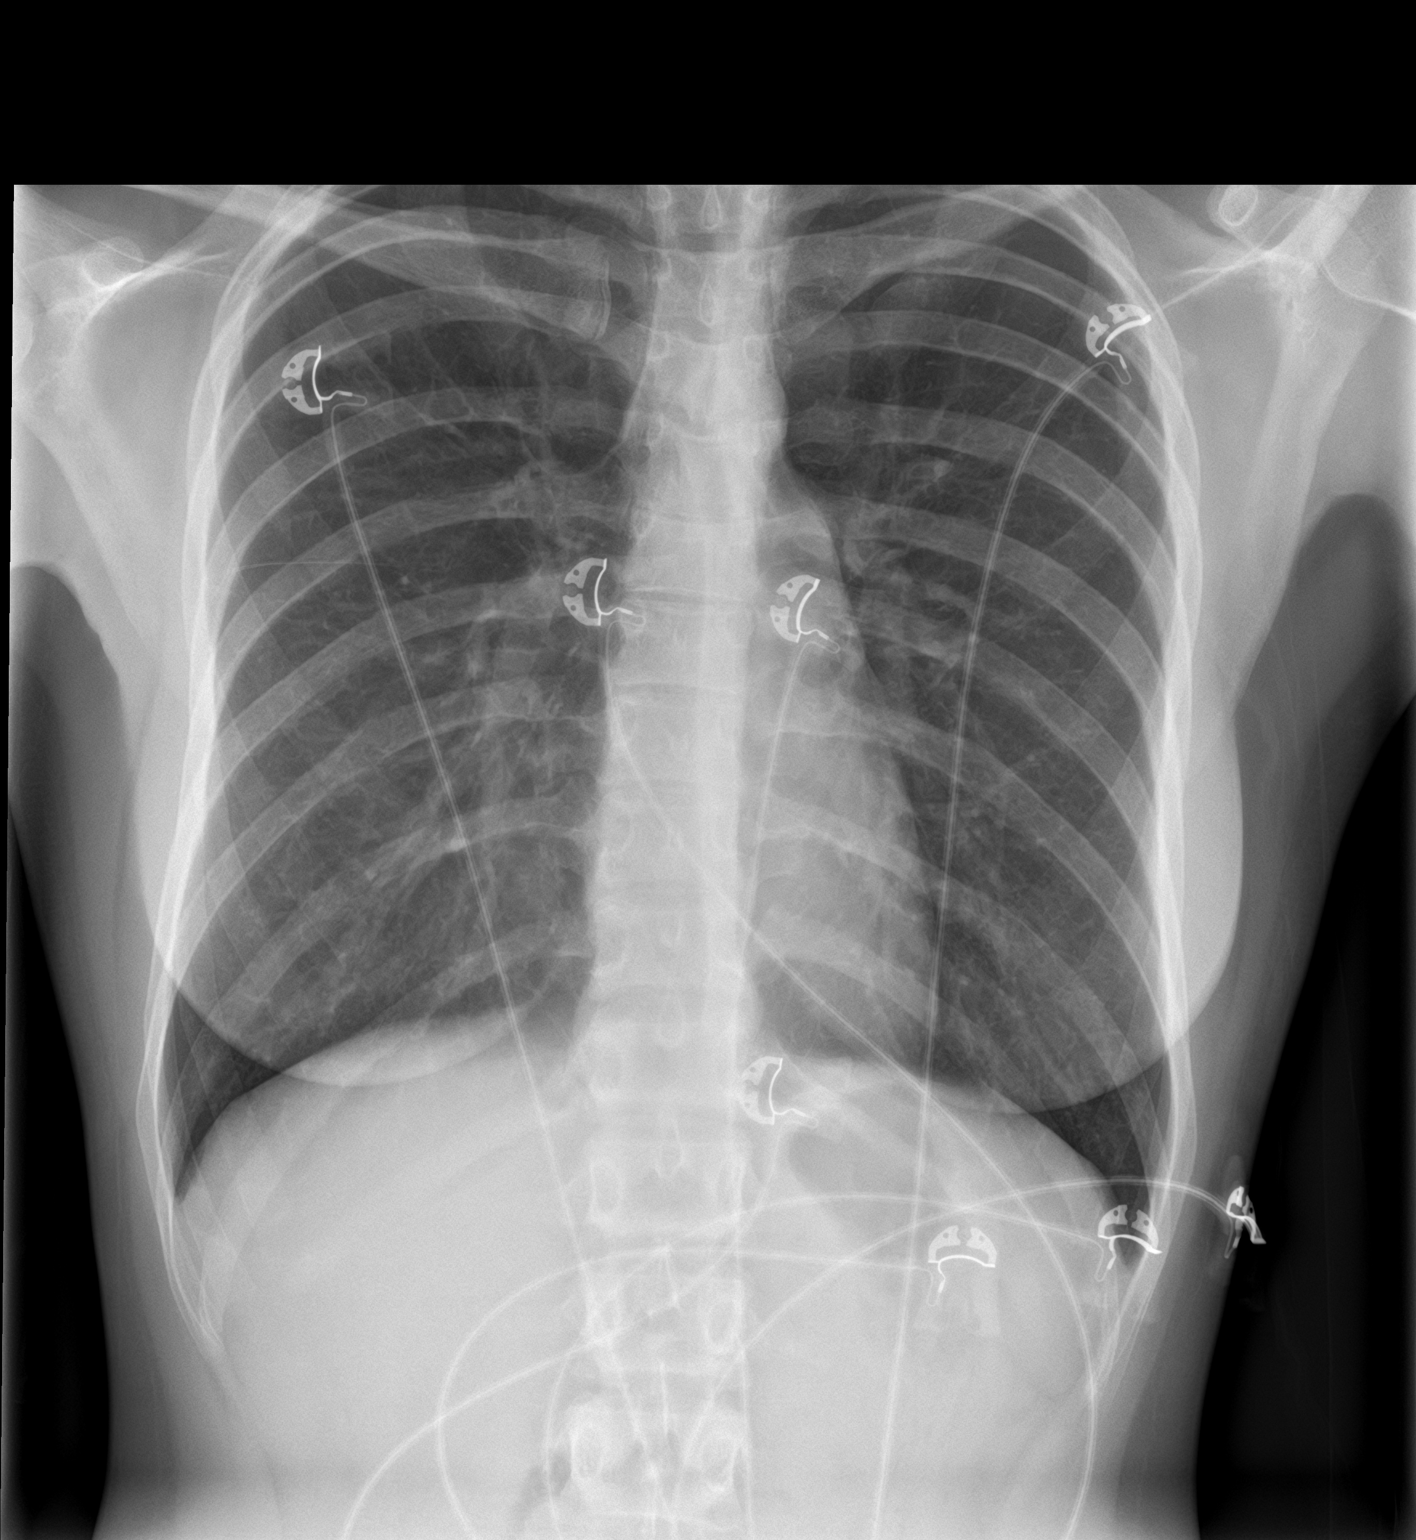

[chest lat]
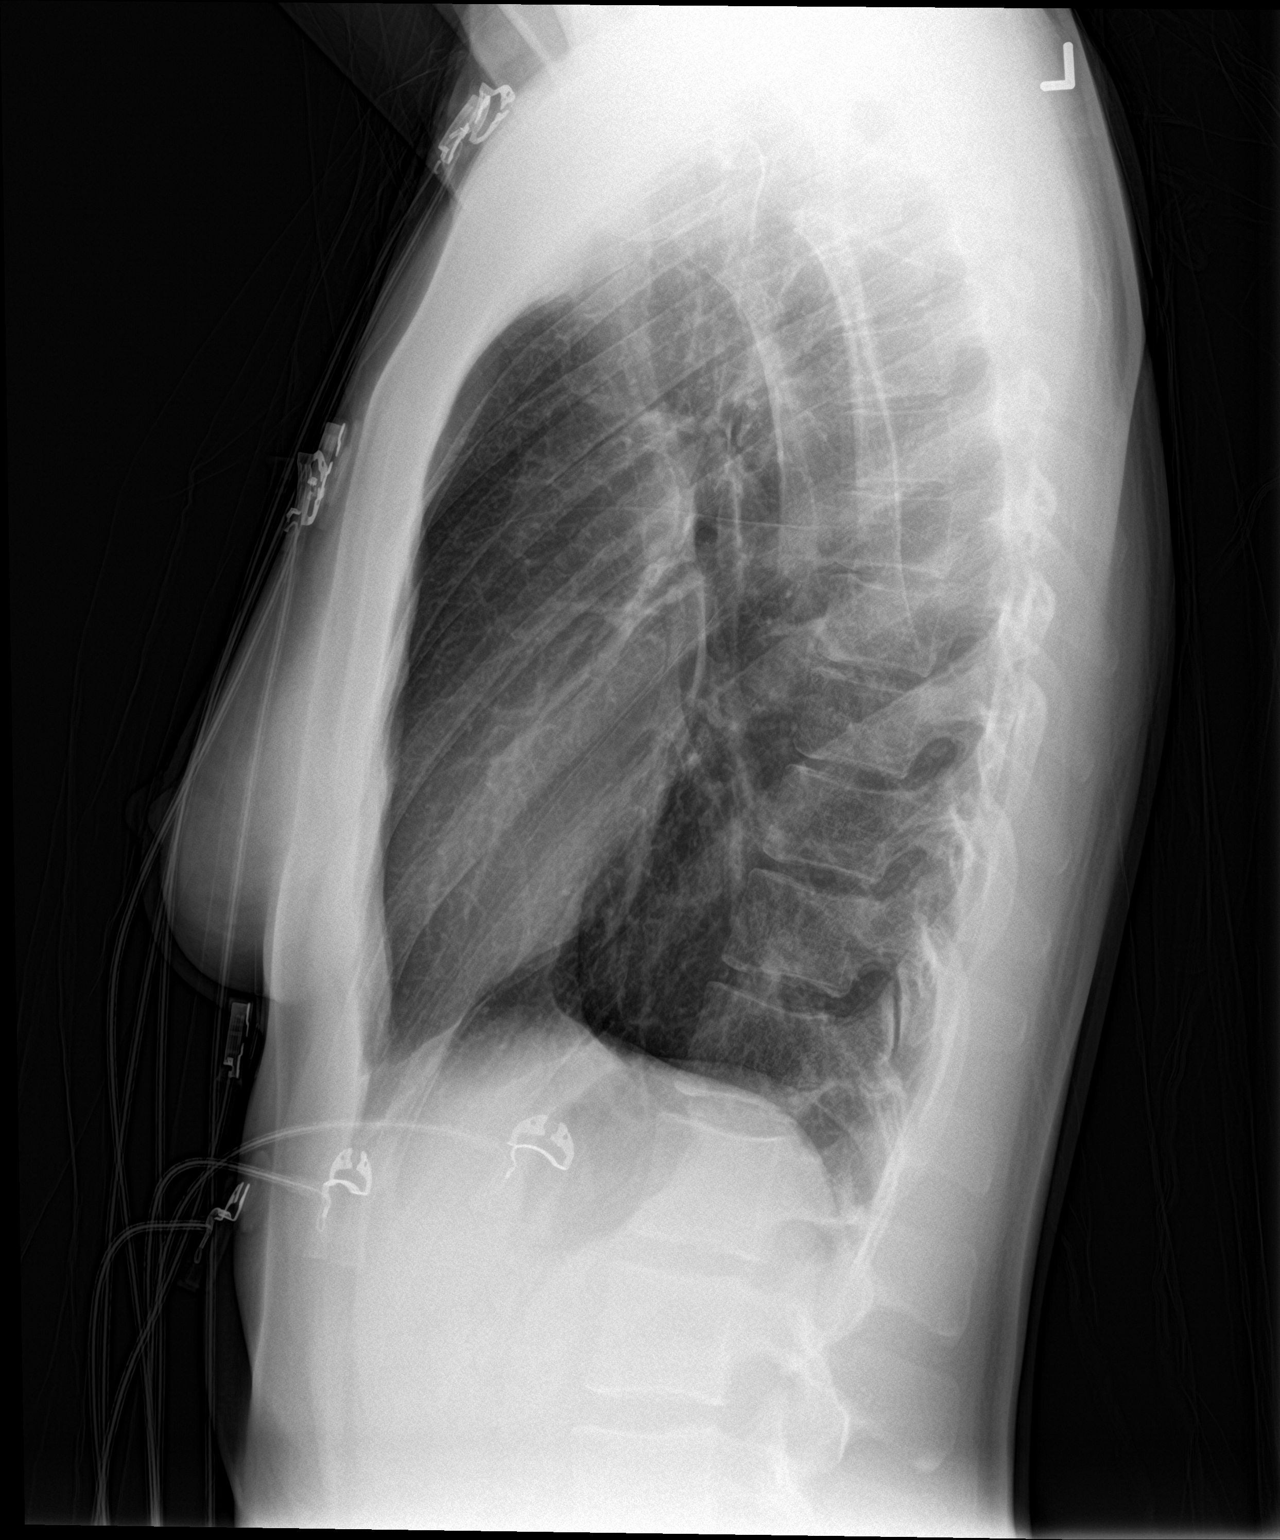

[2 of 2 positions shown; findings below may reference images not displayed]

FINDINGS: Normal cardiac and mediastinal contours. No consolidative pulmonary
opacities. No pleural effusion or pneumothorax.
IMPRESSION: No acute cardiopulmonary process.

## 2019-01-25 IMAGING — US US OB COMP LESS 14 WK
1 series · 14 of 28 positions shown · non-contrast
Comparison: None.

CLINICAL DATA: Lower abdominal pain for 1 day. No vaginal bleeding.
Estimated gestational age by LMP is 11 weeks 2 days.

EXAM:
OBSTETRIC <14 WK ULTRASOUND
TECHNIQUE: Transabdominal ultrasound was performed for evaluation of the
gestation as well as the maternal uterus and adnexal regions.

[Series 1: us ob comp less 14 wk · 0.22mm/px · 36 acquisitions, 14 frames shown]
[im 2/36]
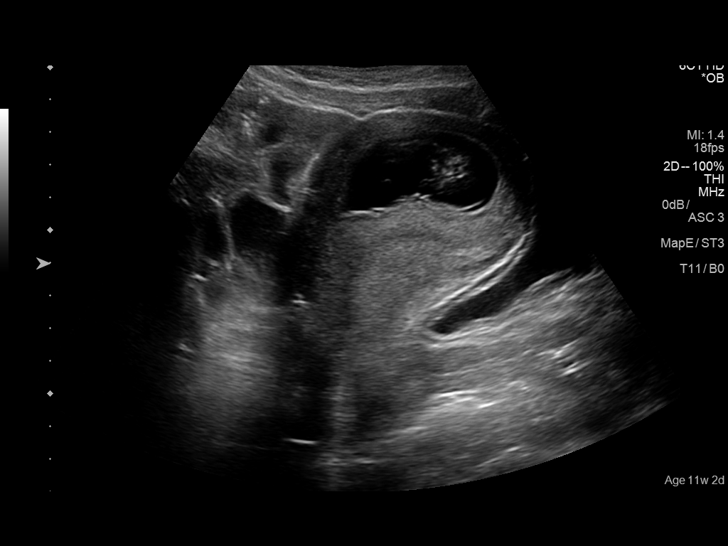
[im 4/36]
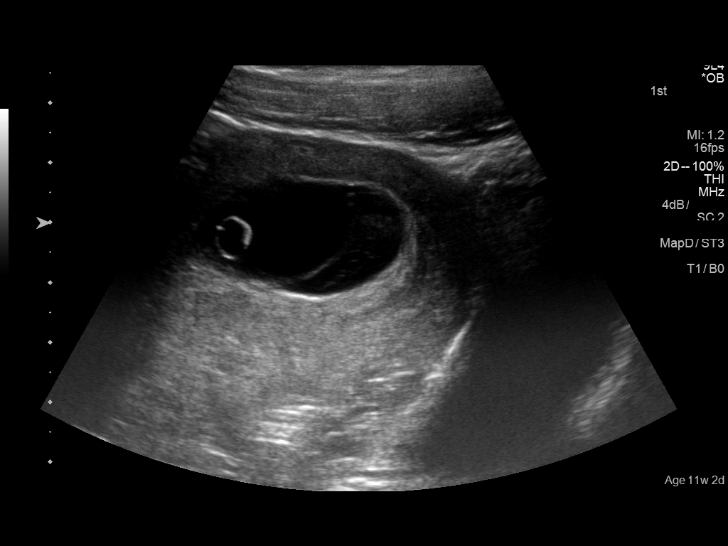
[im 7/36]
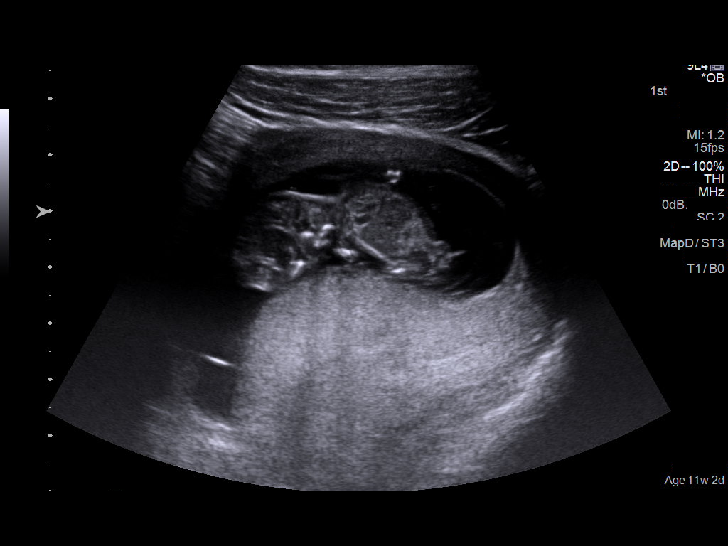
[im 10/36]
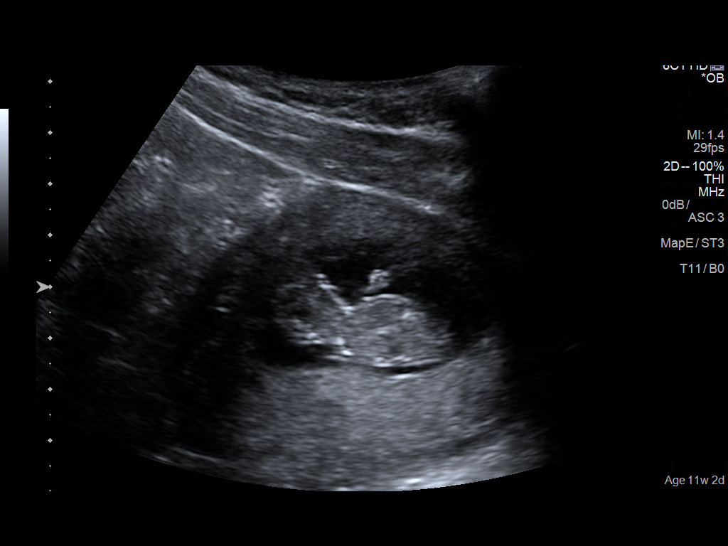
[im 12/36]
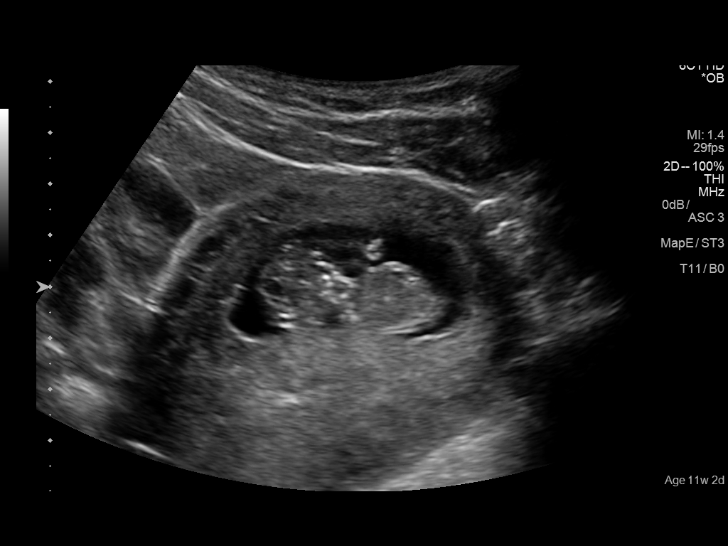
[im 15/36]
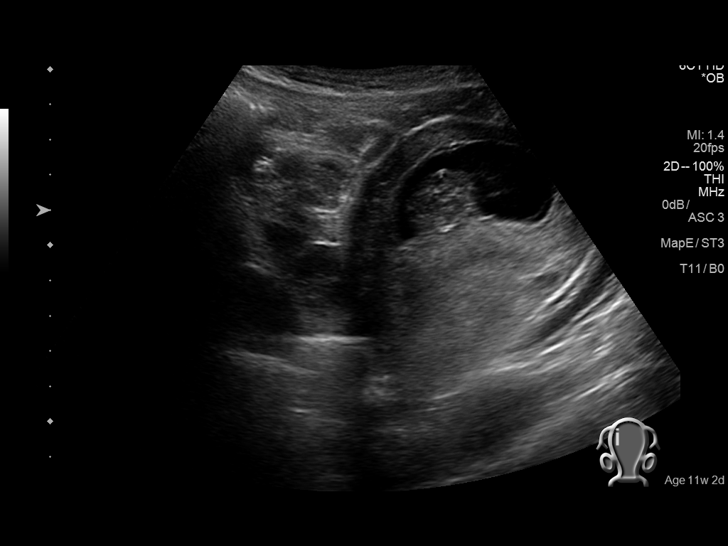
[im 17/36]
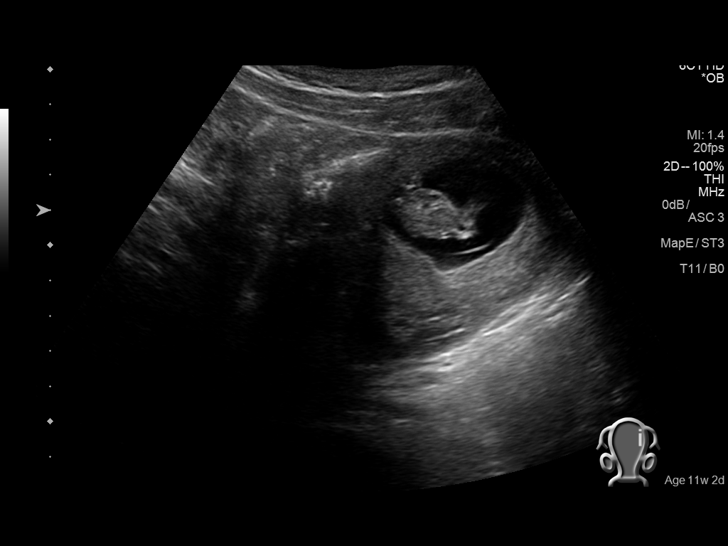
[im 20/36]
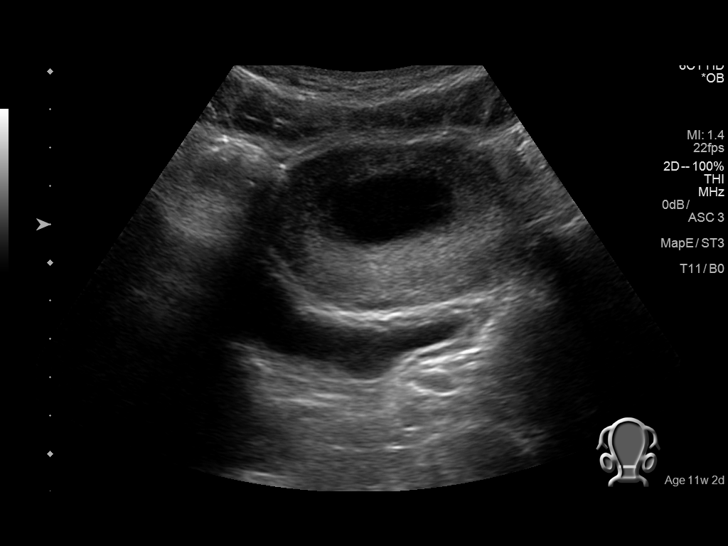
[im 23/36]
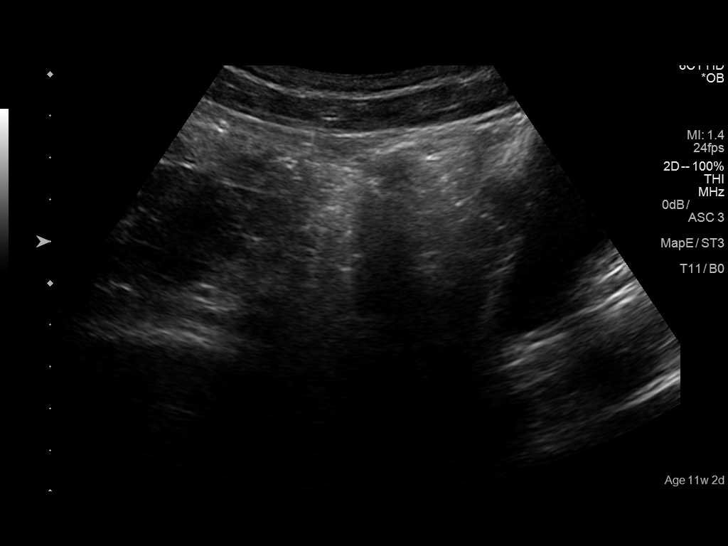
[im 25/36]
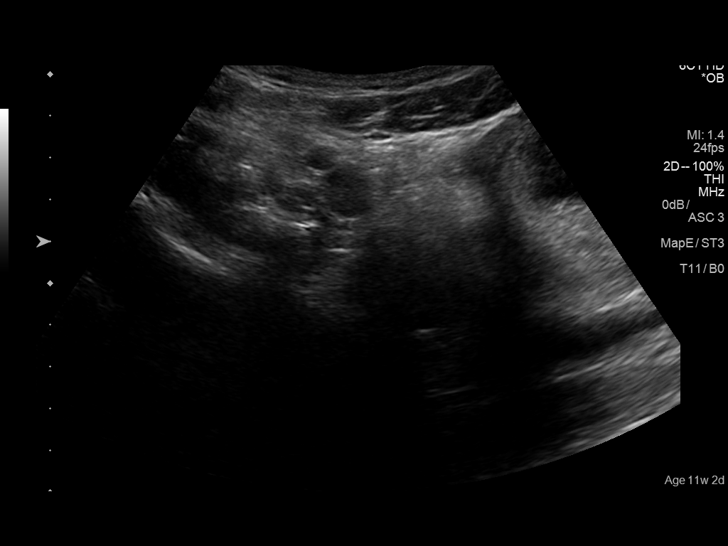
[im 28/36]
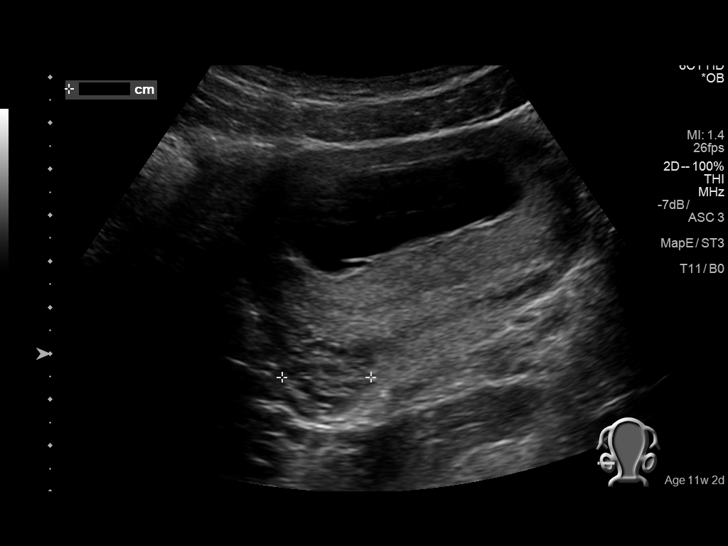
[im 30/36]
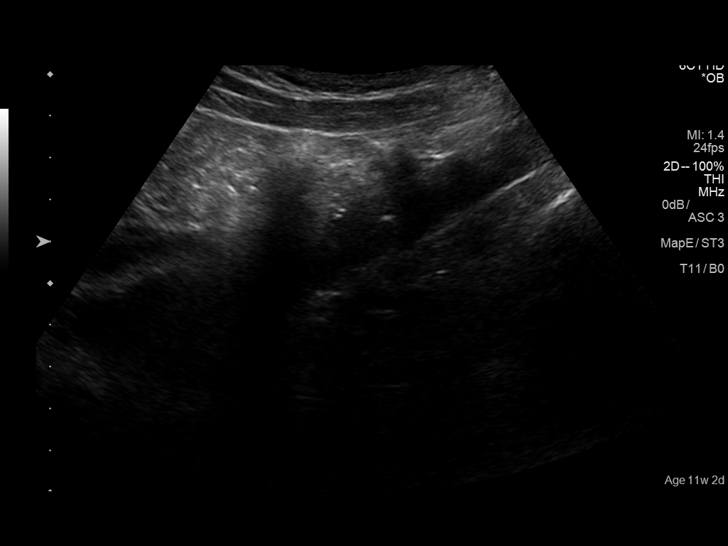
[im 33/36]
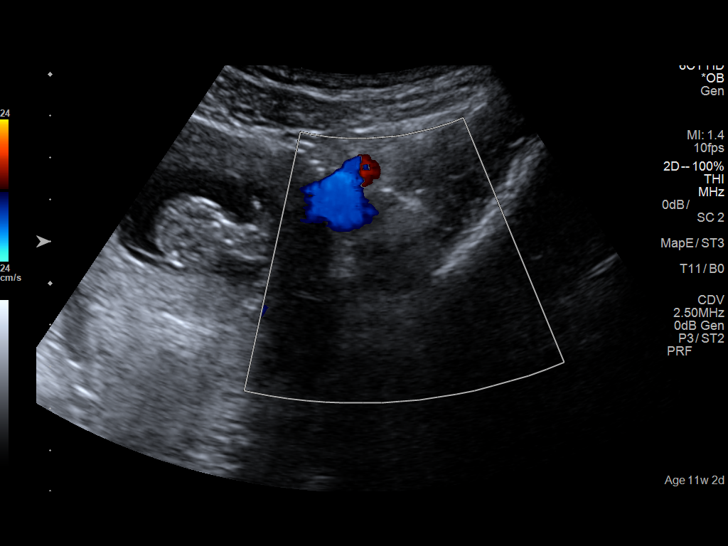
[im 36/36]
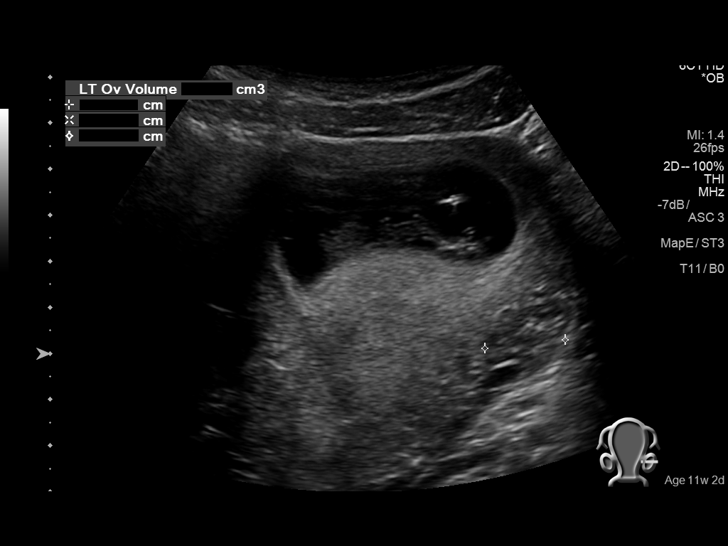

[14 of 28 positions shown; findings below may reference images not displayed]

FINDINGS: Intrauterine gestational sac: Single intrauterine pregnancy is
present.

Yolk sac:  Yolk sac is present.

Embryo:  Fetal pole is present.

Cardiac Activity: Fetal cardiac activity is observed.

Heart Rate: 160 bpm

CRL:   38  mm   10 w 5 d                  US EDC: 02/09/2017

Subchorionic hemorrhage:  None visualized.

Maternal uterus/adnexae: Uterus is anteverted. No abnormal adnexal
masses demonstrated. Both ovaries are visualized and appear normal.
No free fluid in the pelvis.
IMPRESSION: Single intrauterine pregnancy. Estimated gestational age by
crown-rump length is 10 weeks 5 days. No acute complication
demonstrated sonographically.

## 2019-02-04 ENCOUNTER — Encounter: Payer: Self-pay | Admitting: Advanced Practice Midwife

## 2019-02-04 ENCOUNTER — Ambulatory Visit: Payer: Self-pay | Admitting: Advanced Practice Midwife

## 2019-02-04 ENCOUNTER — Other Ambulatory Visit: Payer: Self-pay

## 2019-02-04 DIAGNOSIS — F419 Anxiety disorder, unspecified: Secondary | ICD-10-CM

## 2019-02-04 DIAGNOSIS — Z113 Encounter for screening for infections with a predominantly sexual mode of transmission: Secondary | ICD-10-CM

## 2019-02-04 DIAGNOSIS — F1290 Cannabis use, unspecified, uncomplicated: Secondary | ICD-10-CM | POA: Insufficient documentation

## 2019-02-04 LAB — WET PREP FOR TRICH, YEAST, CLUE
Trichomonas Exam: NEGATIVE
Yeast Exam: NEGATIVE

## 2019-02-04 MED ORDER — AZITHROMYCIN 500 MG PO TABS
1000.0000 mg | ORAL_TABLET | Freq: Once | ORAL | Status: AC
  Administered 2019-02-04: 1000 mg via ORAL

## 2019-02-04 NOTE — Progress Notes (Signed)
Wet mount reviewed with provider. Patient treated as contact to NGU, no other treatment indicated. Jenetta Downer, RN

## 2019-02-04 NOTE — Addendum Note (Signed)
Addended by: Jenetta Downer on: 02/04/2019 04:13 PM   Modules accepted: Orders

## 2019-02-04 NOTE — Progress Notes (Addendum)
STI clinic/screening visit  Subjective:  Savannah Carson is a 23 y.o. G1P1 SBF nonsmoker female being seen today for an STI screening visit. The patient reports they do not have symptoms.  Patient reports that they do not desire a pregnancy in the next year.   They reported they are interested in discussing contraception today.   Patient has the following medical conditions:   Patient Active Problem List   Diagnosis Date Noted  . Marijuana use 02/04/2019  . Anxiety 02/04/2019     Chief Complaint  Patient presents with  . Exposure to STD    HPI  Patient reports asymptomatic.  Contact to NGU.  Last MJ yesterday.  Last ETOH yesterday (1 beer + 2 shots Jose) q weekend.  No menses while on DMPA.  See flowsheet for further details and programmatic requirements.    The following portions of the patient's history were reviewed and updated as appropriate: allergies, current medications, past medical history, past social history, past surgical history and problem list.  Objective:  There were no vitals filed for this visit.  Physical Exam Vitals signs and nursing note reviewed.  Constitutional:      Appearance: Normal appearance.  HENT:     Head: Normocephalic and atraumatic.     Mouth/Throat:     Mouth: Mucous membranes are moist.     Pharynx: Oropharynx is clear. No oropharyngeal exudate or posterior oropharyngeal erythema.  Eyes:     Conjunctiva/sclera: Conjunctivae normal.  Pulmonary:     Effort: Pulmonary effort is normal.  Abdominal:     General: Abdomen is flat.     Palpations: Abdomen is soft. There is no mass.     Tenderness: There is no abdominal tenderness. There is no rebound.     Comments: Soft without tenderness  Genitourinary:    General: Normal vulva.     Exam position: Lithotomy position.     Pubic Area: No rash or pubic lice.      Labia:        Right: No rash or lesion.        Left: No rash or lesion.      Vagina: Vaginal discharge (white creamy  leukorrhea, ph <4.5) present. No erythema, bleeding or lesions.     Cervix: Normal.     Uterus: Normal.      Adnexa: Right adnexa normal and left adnexa normal.     Rectum: Normal.  Lymphadenopathy:     Head:     Right side of head: No preauricular or posterior auricular adenopathy.     Left side of head: No preauricular or posterior auricular adenopathy.     Cervical: No cervical adenopathy.     Upper Body:     Right upper body: No supraclavicular or axillary adenopathy.     Left upper body: No supraclavicular or axillary adenopathy.     Lower Body: No right inguinal adenopathy. No left inguinal adenopathy.  Skin:    General: Skin is warm and dry.     Findings: No rash.  Neurological:     Mental Status: She is alert and oriented to person, place, and time.       Assessment and Plan:  Savannah Carson is a 23 y.o. female presenting to the [facility] Department for STI screening  1. Screening examination for venereal disease Treat wet mount per standing orders Immunization nurse consult Treat as contact to NGU - Syphilis Serology, [facility] - Chlamydia/Gonorrhea   Lab - HIV [facility] - WET PREP FOR [place], YEAST, CLUE  2. Marijuana use Last use yesterday  3. Anxiety On Lexapro     Return if symptoms worsen or fail to improve.  Future Appointments  Date Time Provider [place]  03/04/2019  3:00 PM Philip Aspen, CNM [place] None    Herbie Saxon, CNM

## 2019-02-04 NOTE — Progress Notes (Addendum)
Patient here for STD screening. Patient states she is a contact to NGU.Marland KitchenJenetta Downer, RN

## 2019-02-27 ENCOUNTER — Encounter: Payer: BC Managed Care – PPO | Admitting: Obstetrics and Gynecology

## 2019-03-04 ENCOUNTER — Telehealth: Payer: Self-pay

## 2019-03-04 ENCOUNTER — Encounter: Payer: BC Managed Care – PPO | Admitting: Certified Nurse Midwife

## 2019-03-04 ENCOUNTER — Other Ambulatory Visit: Payer: Self-pay

## 2019-03-04 NOTE — Telephone Encounter (Signed)
Pt needs a order for her depo injection sent to pharmacy.

## 2019-03-04 NOTE — Telephone Encounter (Signed)
Patient is running 10 mins behind. Wants to know if Savannah Carson could still here. Told patient both Savannah Carson and her nurse was in a room and I was unable to obtain permission Patient was notified of 5 min grace period Please call patient

## 2019-04-23 ENCOUNTER — Encounter: Payer: BC Managed Care – PPO | Admitting: Certified Nurse Midwife

## 2019-06-03 ENCOUNTER — Other Ambulatory Visit: Payer: Self-pay

## 2019-06-03 MED ORDER — MEDROXYPROGESTERONE ACETATE 150 MG/ML IM SUSY: 150.0000 mg | PREFILLED_SYRINGE | INTRAMUSCULAR | 0 refills | Status: DC

## 2019-06-03 NOTE — Telephone Encounter (Signed)
Refill on Depo sent to [facility] on [address] per faxed request. #1 x 0.

## 2019-06-10 ENCOUNTER — Other Ambulatory Visit: Payer: Self-pay

## 2019-06-10 ENCOUNTER — Other Ambulatory Visit (HOSPITAL_COMMUNITY)
Admission: RE | Admit: 2019-06-10 | Discharge: 2019-06-10 | Disposition: A | Discharge status: Home / Self Care | Payer: BC Managed Care – PPO | Source: Ambulatory Visit | Attending: Certified Nurse Midwife | Admitting: Certified Nurse Midwife

## 2019-06-10 ENCOUNTER — Encounter: Payer: Self-pay | Admitting: Certified Nurse Midwife

## 2019-06-10 ENCOUNTER — Ambulatory Visit (INDEPENDENT_AMBULATORY_CARE_PROVIDER_SITE_OTHER): Payer: BC Managed Care – PPO | Admitting: Certified Nurse Midwife

## 2019-06-10 VITALS — BP 118/81 | HR 81 | Ht 69.0 in | Wt 189.0 lb

## 2019-06-10 DIAGNOSIS — Z124 Encounter for screening for malignant neoplasm of cervix: Secondary | ICD-10-CM | POA: Insufficient documentation

## 2019-06-10 DIAGNOSIS — Z136 Encounter for screening for cardiovascular disorders: Secondary | ICD-10-CM

## 2019-06-10 DIAGNOSIS — Z113 Encounter for screening for infections with a predominantly sexual mode of transmission: Secondary | ICD-10-CM

## 2019-06-10 DIAGNOSIS — Z01419 Encounter for gynecological examination (general) (routine) without abnormal findings: Secondary | ICD-10-CM

## 2019-06-10 DIAGNOSIS — Z13220 Encounter for screening for lipoid disorders: Secondary | ICD-10-CM | POA: Diagnosis not present

## 2019-06-10 DIAGNOSIS — N6452 Nipple discharge: Secondary | ICD-10-CM

## 2019-06-10 MED ORDER — MEDROXYPROGESTERONE ACETATE 150 MG/ML IM SUSY: 150.0000 mg | PREFILLED_SYRINGE | INTRAMUSCULAR | 1 refills | Status: DC

## 2019-06-10 MED ORDER — MEDROXYPROGESTERONE ACETATE 150 MG/ML IM SUSY: 150.0000 mg | PREFILLED_SYRINGE | INTRAMUSCULAR | 3 refills | Status: DC

## 2019-06-10 NOTE — Patient Instructions (Signed)
Preventive Care 21-22 Years Old, Female Preventive care refers to visits with your health care provider and lifestyle choices that can promote health and wellness. This includes:  A yearly physical exam. This may also be called an annual well check.  Regular dental visits and eye exams.  Immunizations.  Screening for certain conditions.  Healthy lifestyle choices, such as eating a healthy diet, getting regular exercise, not using drugs or products that contain nicotine and tobacco, and limiting alcohol use. What can I expect for my preventive care visit? Physical exam Your health care provider will check your:  Height and weight. This may be used to calculate body mass index (BMI), which tells if you are at a healthy weight.  Heart rate and blood pressure.  Skin for abnormal spots. Counseling Your health care provider may ask you questions about your:  Alcohol, tobacco, and drug use.  Emotional well-being.  Home and relationship well-being.  Sexual activity.  Eating habits.  Work and work environment.  Method of birth control.  Menstrual cycle.  Pregnancy history. What immunizations do I need?  Influenza (flu) vaccine  This is recommended every year. Tetanus, diphtheria, and pertussis (Tdap) vaccine  You may need a Td booster every 10 years. Varicella (chickenpox) vaccine  You may need this if you have not been vaccinated. Human papillomavirus (HPV) vaccine  If recommended by your health care provider, you may need three doses over 6 months. Measles, mumps, and rubella (MMR) vaccine  You may need at least one dose of MMR. You may also need a second dose. Meningococcal conjugate (MenACWY) vaccine  One dose is recommended if you are age 19-21 years and a first-year college student living in a residence hall, or if you have one of several medical conditions. You may also need additional booster doses. Pneumococcal conjugate (PCV13) vaccine  You may need  this if you have certain conditions and were not previously vaccinated. Pneumococcal polysaccharide (PPSV23) vaccine  You may need one or two doses if you smoke cigarettes or if you have certain conditions. Hepatitis A vaccine  You may need this if you have certain conditions or if you travel or work in places where you may be exposed to hepatitis A. Hepatitis B vaccine  You may need this if you have certain conditions or if you travel or work in places where you may be exposed to hepatitis B. Haemophilus influenzae type b (Hib) vaccine  You may need this if you have certain conditions. You may receive vaccines as individual doses or as more than one vaccine together in one shot (combination vaccines). Talk with your health care provider about the risks and benefits of combination vaccines. What tests do I need?  Blood tests  Lipid and cholesterol levels. These may be checked every 5 years starting at age 20.  Hepatitis C test.  Hepatitis B test. Screening  Diabetes screening. This is done by checking your blood sugar (glucose) after you have not eaten for a while (fasting).  Sexually transmitted disease (STD) testing.  BRCA-related cancer screening. This may be done if you have a family history of breast, ovarian, tubal, or peritoneal cancers.  Pelvic exam and Pap test. This may be done every 3 years starting at age 21. Starting at age 30, this may be done every 5 years if you have a Pap test in combination with an HPV test. Talk with your health care provider about your test results, treatment options, and if necessary, the need for more tests.   Follow these instructions at home: Eating and drinking   Eat a diet that includes fresh fruits and vegetables, whole grains, lean protein, and low-fat dairy.  Take vitamin and mineral supplements as recommended by your health care provider.  Do not drink alcohol if: ? Your health care provider tells you not to drink. ? You are  pregnant, may be pregnant, or are planning to become pregnant.  If you drink alcohol: ? Limit how much you have to 0-1 drink a day. ? Be aware of how much alcohol is in your drink. In the [place] one drink equals one 12 oz bottle of beer (355 mL), one 5 oz glass of wine (148 mL), or one 1 oz glass of hard liquor (44 mL). Lifestyle  Take daily care of your teeth and gums.  Stay active. Exercise for at least 30 minutes on 5 or more days each week.  Do not use any products that contain nicotine or tobacco, such as cigarettes, e-cigarettes, and chewing tobacco. If you need help quitting, ask your health care provider.  If you are sexually active, practice safe sex. Use a condom or other form of birth control (contraception) in order to prevent pregnancy and STIs (sexually transmitted infections). If you plan to become pregnant, see your health care provider for a preconception visit. What's next?  Visit your health care provider once a year for a well check visit.  Ask your health care provider how often you should have your eyes and teeth checked.  Stay up to date on all vaccines. This information is not intended to replace advice given to you by your health care provider. Make sure you discuss any questions you have with your health care provider. Document Revised: 11/08/2017 Document Reviewed: 11/08/2017 Elsevier Patient Education  2020 [employer].

## 2019-06-10 NOTE — Progress Notes (Signed)
GYNECOLOGY ANNUAL PREVENTATIVE CARE ENCOUNTER NOTE  History:     Savannah Carson is a 24 y.o. G1P1001 female here for a routine annual gynecologic exam.  Current complaints: right sided nipple discharge. She denies trama or excess stimulation to nipples   Denies abnormal vaginal bleeding, discharge, pelvic pain, problems with intercourse or other gynecologic concerns.     Social Relationship-female partner Living: with parents and her child Work -[employer]. Children-1 Exercise: no Drugs/Alcohol/Smoking: denies   Gynecologic History No LMP recorded. Patient has had an injection. Contraception: Depo-Provera injections Last Pap: has not had Last mammogram: n/a  Obstetric History OB History  Gravida Para Term Preterm AB Living  1 1 1     1   SAB TAB Ectopic Multiple Live Births        0 1    # Outcome Date GA Lbr Len/2nd Weight Sex Delivery Anes PTL Lv  1 Term 02/07/17 [redacted]w[redacted]d  7 lb 14.6 oz (3.59 kg) F Vag-Spont EPI  LIV    Past Medical History:  Diagnosis Date  . Anxiety   . Asthma     No past surgical history on file.  Current Outpatient Medications on File Prior to Visit  Medication Sig Dispense Refill  . albuterol (PROVENTIL HFA;VENTOLIN HFA) 108 (90 Base) MCG/ACT inhaler Inhale 2 puffs into the lungs every 4 (four) hours as needed for wheezing or shortness of breath.     . medroxyPROGESTERone Acetate 150 MG/ML SUSY Inject 1 mL (150 mg total) as directed every 3 (three) months. 1 mL 0  . sertraline (ZOLOFT) 25 MG tablet Take 1 tablet (25 mg total) by mouth daily. 30 tablet 6   No current facility-administered medications on file prior to visit.    Allergies  Allergen Reactions  . Augmentin [Amoxicillin-Pot Clavulanate] Hives  . Cefprozil Other (See Comments)  . Cephalosporins Hives  . Penicillins Rash  . Sulfamethoxazole-Trimethoprim Rash    Social History:  reports that she has never smoked. She has never used smokeless tobacco. She  reports current alcohol use of about 3.0 standard drinks of alcohol per week. She reports current drug use. Drug: Marijuana.  No family history on file.  The following portions of the patient's history were reviewed and updated as appropriate: allergies, current medications, past family history, past medical history, past social history, past surgical history and problem list.  Review of Systems Pertinent items noted in HPI and remainder of comprehensive ROS otherwise negative.  Physical Exam:  BP 118/81   Pulse 81   Ht 5\' 9"  (1.753 m)   Wt 189 lb (85.7 kg)   BMI 27.91 kg/m  CONSTITUTIONAL: Well-developed, well-nourished female in no acute distress.  HENT:  Normocephalic, atraumatic, External right and left ear normal. Oropharynx is clear and moist EYES: Conjunctivae and EOM are normal. Pupils are equal, round, and reactive to light. No scleral icterus.  NECK: Normal range of motion, supple, no masses.  Normal thyroid.  SKIN: Skin is warm and dry. No rash noted. Not diaphoretic. No erythema. No pallor. MUSCULOSKELETAL: Normal range of motion. No tenderness.  No cyanosis, clubbing, or edema.  2+ distal pulses. NEUROLOGIC: Alert and oriented to person, place, and time. Normal reflexes, muscle tone coordination.  PSYCHIATRIC: Normal mood and affect. Normal behavior. Normal judgment and thought content. CARDIOVASCULAR: Normal heart rate noted, regular rhythm RESPIRATORY: Clear to auscultation bilaterally. Effort and breath sounds normal, no problems with respiration noted. BREASTS: Symmetric in size. No masses, tenderness, skin changes,  nipple drainage on exam, or lymphadenopathy bilaterally. Performed in the presence of a chaperone. Bilateral nipple piercing's  ABDOMEN: Soft, no distention noted.  No tenderness, rebound or guarding.  PELVIC: Normal appearing external genitalia and urethral meatus; normal appearing vaginal mucosa and cervix.  No abnormal discharge noted.  Pap smear obtained.   Normal uterine size, no other palpable masses, no uterine or adnexal tenderness.  Performed in the presence of a chaperone.   Assessment and Plan:    1. Women's annual routine gynecological examination  Will follow up results of pap smear and manage accordingly. Mammogram not indicated Labs: STD testing, pap smear, prolactin, Lipid panel  Refills: Depo injection  Discussed probable cause of nipple discharge due to nipple piercing stimulating the breast.  Routine preventative health maintenance measures emphasized. Please refer to After Visit Summary for other counseling recommendations.     Doreene Burke, CNM  [facility]

## 2019-06-11 LAB — LIPID PANEL
Chol/HDL Ratio: 4.4 ratio (ref 0.0–4.4)
Cholesterol, Total: 196 mg/dL (ref 100–199)
HDL: 45 mg/dL (ref >39)
LDL Chol Calc (NIH): 139 mg/dL — ABNORMAL HIGH (ref 0–99)
Triglycerides: 68 mg/dL (ref 0–149)
VLDL Cholesterol Cal: 12 mg/dL (ref 5–40)

## 2019-06-11 LAB — HIV ANTIBODY (ROUTINE TESTING W REFLEX): HIV Screen 4th Generation wRfx: NONREACTIVE

## 2019-06-11 LAB — HEPATITIS B SURFACE ANTIGEN: Hepatitis B Surface Ag: NEGATIVE

## 2019-06-11 LAB — HSV 1 AND 2 IGM ABS, INDIRECT
HSV 1 IgM: <1:10 {titer}
HSV 2 IgM: <1:10 {titer}

## 2019-06-11 LAB — PROLACTIN: Prolactin: 15.5 ng/mL (ref 4.8–23.3)

## 2019-06-11 LAB — RPR: RPR Ser Ql: NONREACTIVE

## 2019-06-12 LAB — CYTOLOGY - PAP
Chlamydia: NEGATIVE
Comment: NEGATIVE
Comment: NEGATIVE
Comment: NORMAL
Diagnosis: NEGATIVE
Neisseria Gonorrhea: NEGATIVE
Trichomonas: NEGATIVE

## 2019-09-16 ENCOUNTER — Ambulatory Visit: Payer: BC Managed Care – PPO

## 2019-09-17 ENCOUNTER — Ambulatory Visit: Payer: BC Managed Care – PPO

## 2019-09-17 ENCOUNTER — Encounter: Payer: Self-pay | Admitting: Family Medicine

## 2019-09-17 ENCOUNTER — Ambulatory Visit: Payer: Self-pay | Admitting: Family Medicine

## 2019-09-17 ENCOUNTER — Other Ambulatory Visit: Payer: Self-pay

## 2019-09-17 DIAGNOSIS — Z113 Encounter for screening for infections with a predominantly sexual mode of transmission: Secondary | ICD-10-CM

## 2019-09-17 LAB — WET PREP FOR TRICH, YEAST, CLUE
Trichomonas Exam: NEGATIVE
Yeast Exam: NEGATIVE

## 2019-09-17 NOTE — Progress Notes (Signed)
[facility] Department STI clinic/screening visit  Subjective:  Savannah Carson is a 24 y.o. female being seen today for an STI screening visit. The patient reports they do have symptoms.  Patient reports that they do not desire a pregnancy in the next year.   They reported they are not interested in discussing contraception today.  No LMP recorded. Patient has had an injection.   Patient has the following medical conditions:   Patient Active Problem List   Diagnosis Date Noted  . Marijuana use 02/04/2019  . Anxiety 02/04/2019    Chief Complaint  Patient presents with  . SEXUALLY TRANSMITTED DISEASE    HPI  Patient reports that she has had a uncomfortable sensation with urination x 2 days.  She denies vaginal disch or other STD symptoms.  States that she has changed her laundry detergent to Gain.  Last HIV test per patient/review of record was 2021 Patient reports last pap was 05/2019   See flowsheet for further details and programmatic requirements.    The following portions of the patient's history were reviewed and updated as appropriate: allergies, current medications, past medical history, past social history, past surgical history and problem list.  Objective:  There were no vitals filed for this visit.  Physical Exam Vitals and nursing note reviewed.  Constitutional:      Appearance: Normal appearance.  HENT:     Head: Normocephalic and atraumatic.     Mouth/Throat:     Mouth: Mucous membranes are moist.     Pharynx: Oropharynx is clear. No oropharyngeal exudate or posterior oropharyngeal erythema.  Pulmonary:     Effort: Pulmonary effort is normal.  Abdominal:     General: Abdomen is flat.     Palpations: There is no mass.     Tenderness: There is no abdominal tenderness. There is no rebound.  Genitourinary:    General: Normal vulva.     Exam position: Lithotomy position.     Pubic Area: No rash or pubic lice.      Labia:        Right: No  rash or lesion.        Left: No rash or lesion.      Vagina: Normal. No vaginal discharge, erythema, bleeding or lesions.     Cervix: No cervical motion tenderness, discharge, friability, lesion, erythema or cervical bleeding.     Uterus: Normal.      Adnexa: Right adnexa normal and left adnexa normal.     Rectum: Normal.     Comments: PH<4.5 Lymphadenopathy:     Head:     Right side of head: No preauricular or posterior auricular adenopathy.     Left side of head: No preauricular or posterior auricular adenopathy.     Cervical: No cervical adenopathy.     Upper Body:     Right upper body: No supraclavicular or axillary adenopathy.     Left upper body: No supraclavicular or axillary adenopathy.     Lower Body: No right inguinal adenopathy. No left inguinal adenopathy.  Skin:    General: Skin is warm and dry.     Findings: No rash.  Neurological:     Mental Status: She is alert and oriented to person, place, and time.    Assessment and Plan:  Savannah Carson is a 24 y.o. female presenting to the [facility] Department for STI screening  1. Screening examination for venereal disease  - WET PREP FOR TRICH, YEAST, CLUE -  Gonococcus culture - Chlamydia/Gonorrhea [facility] - Syphilis Serology, [facility] - HIV [facility] -Co client to change laundry detergent to a milder and non-fragrant.   D/C vaginal lubrication with fragrance. -Increase daily water consumption- decrease soda amount.   If symptoms continue recommend to see PCP for evaluation   No follow-ups on file.  Future Appointments  Date Time Provider Department Center  06/14/2020  2:30 PM Doreene Burke, CNM [facility] None    Larene Pickett, FNP

## 2019-09-17 NOTE — Progress Notes (Signed)
Wet mount reviewed, no tx per standing order. Provider orders completed. 

## 2019-09-22 LAB — GONOCOCCUS CULTURE

## 2020-01-14 ENCOUNTER — Other Ambulatory Visit: Payer: Self-pay | Admitting: Family Medicine

## 2020-01-14 DIAGNOSIS — N6452 Nipple discharge: Secondary | ICD-10-CM

## 2020-01-19 ENCOUNTER — Ambulatory Visit
Admission: RE | Admit: 2020-01-19 | Discharge: 2020-01-19 | Disposition: A | Discharge status: Home / Self Care | Payer: BC Managed Care – PPO | Source: Ambulatory Visit | Attending: Family Medicine | Admitting: Family Medicine

## 2020-01-19 ENCOUNTER — Other Ambulatory Visit: Payer: Self-pay

## 2020-01-19 ENCOUNTER — Other Ambulatory Visit: Payer: Self-pay | Admitting: Family Medicine

## 2020-01-19 DIAGNOSIS — N6452 Nipple discharge: Secondary | ICD-10-CM | POA: Diagnosis not present

## 2020-05-19 ENCOUNTER — Other Ambulatory Visit: Payer: Self-pay | Admitting: "Endocrinology

## 2020-05-19 DIAGNOSIS — E221 Hyperprolactinemia: Secondary | ICD-10-CM

## 2020-06-14 ENCOUNTER — Encounter: Payer: BC Managed Care – PPO | Admitting: Certified Nurse Midwife

## 2020-06-23 ENCOUNTER — Other Ambulatory Visit (HOSPITAL_COMMUNITY)
Admission: RE | Admit: 2020-06-23 | Discharge: 2020-06-23 | Disposition: A | Discharge status: Home / Self Care | Payer: BC Managed Care – PPO | Source: Ambulatory Visit | Attending: Certified Nurse Midwife | Admitting: Certified Nurse Midwife

## 2020-06-23 ENCOUNTER — Other Ambulatory Visit: Payer: Self-pay

## 2020-06-23 ENCOUNTER — Encounter: Payer: Self-pay | Admitting: Certified Nurse Midwife

## 2020-06-23 ENCOUNTER — Ambulatory Visit (INDEPENDENT_AMBULATORY_CARE_PROVIDER_SITE_OTHER): Payer: BC Managed Care – PPO | Admitting: Certified Nurse Midwife

## 2020-06-23 ENCOUNTER — Ambulatory Visit: Payer: BC Managed Care – PPO

## 2020-06-23 VITALS — BP 112/68 | HR 72 | Resp 16 | Ht 69.0 in | Wt 190.2 lb

## 2020-06-23 DIAGNOSIS — Z1159 Encounter for screening for other viral diseases: Secondary | ICD-10-CM

## 2020-06-23 DIAGNOSIS — Z113 Encounter for screening for infections with a predominantly sexual mode of transmission: Secondary | ICD-10-CM | POA: Insufficient documentation

## 2020-06-23 DIAGNOSIS — J309 Allergic rhinitis, unspecified: Secondary | ICD-10-CM | POA: Insufficient documentation

## 2020-06-23 DIAGNOSIS — Z01419 Encounter for gynecological examination (general) (routine) without abnormal findings: Secondary | ICD-10-CM | POA: Diagnosis not present

## 2020-06-23 DIAGNOSIS — R5383 Other fatigue: Secondary | ICD-10-CM | POA: Diagnosis not present

## 2020-06-23 DIAGNOSIS — Z3042 Encounter for surveillance of injectable contraceptive: Secondary | ICD-10-CM

## 2020-06-23 DIAGNOSIS — J45909 Unspecified asthma, uncomplicated: Secondary | ICD-10-CM | POA: Insufficient documentation

## 2020-06-23 MED ORDER — MEDROXYPROGESTERONE ACETATE 150 MG/ML IM SUSY: 150.0000 mg | PREFILLED_SYRINGE | INTRAMUSCULAR | 3 refills | Status: DC

## 2020-06-23 NOTE — Progress Notes (Signed)
GYNECOLOGY ANNUAL PREVENTATIVE CARE ENCOUNTER NOTE  History:     Savannah Carson is a 25 y.o. G82P1001 female here for a routine annual gynecologic exam.  Current complaints: fatigue.   Denies abnormal vaginal bleeding, discharge, pelvic pain, problems with intercourse or other gynecologic concerns.     Social Relationship: female partner Living:with parents and her child  Work: Exercise: Smoke/Alcohol/drug use:  Gynecologic History No LMP recorded. Patient has had an injection. Contraception: Depo-Provera injections Last Pap: 06/10/2019. Results were: normal  Last mammogram: n/a   Obstetric History OB History  Gravida Para Term Preterm AB Living  1 1 1     1   SAB IAB Ectopic Multiple Live Births        0 1    # Outcome Date GA Lbr Len/2nd Weight Sex Delivery Anes PTL Lv  1 Term 02/07/17 [redacted]w[redacted]d  7 lb 14.6 oz (3.59 kg) F Vag-Spont EPI  LIV    Past Medical History:  Diagnosis Date  . Anxiety   . Asthma     No past surgical history on file.  Current Outpatient Medications on File Prior to Visit  Medication Sig Dispense Refill  . albuterol (PROVENTIL HFA;VENTOLIN HFA) 108 (90 Base) MCG/ACT inhaler Inhale 2 puffs into the lungs every 4 (four) hours as needed for wheezing or shortness of breath.     . escitalopram (LEXAPRO) 10 MG tablet Take 10 mg by mouth daily.    . medroxyPROGESTERone Acetate 150 MG/ML SUSY Inject 1 mL (150 mg total) as directed every 3 (three) months. 3 mL 3   No current facility-administered medications on file prior to visit.    Allergies  Allergen Reactions  . Augmentin [Amoxicillin-Pot Clavulanate] Hives  . Cefprozil Other (See Comments)  . Cephalosporins Hives  . Penicillins Rash  . Sulfamethoxazole-Trimethoprim Rash    Social History:  reports that she has never smoked. She has never used smokeless tobacco. She reports current alcohol use of about 3.0 standard drinks of alcohol per week. She reports current drug use. Drug:  Marijuana.  No family history on file.  The following portions of the patient's history were reviewed and updated as appropriate: allergies, current medications, past family history, past medical history, past social history, past surgical history and problem list.  Review of Systems Pertinent items noted in HPI and remainder of comprehensive ROS otherwise negative.  Physical Exam:  There were no vitals taken for this visit. CONSTITUTIONAL: Well-developed, well-nourished female in no acute distress.  HENT:  Normocephalic, atraumatic, External right and left ear normal. Oropharynx is clear and moist EYES: Conjunctivae and EOM are normal. Pupils are equal, round, and reactive to light. No scleral icterus.  NECK: Normal range of motion, supple, no masses.  Normal thyroid.  SKIN: Skin is warm and dry. No rash noted. Not diaphoretic. No erythema. No pallor. Multiple tattoos  MUSCULOSKELETAL: Normal range of motion. No tenderness.  No cyanosis, clubbing, or edema.  2+ distal pulses. NEUROLOGIC: Alert and oriented to person, place, and time. Normal reflexes, muscle tone coordination.  PSYCHIATRIC: Normal mood and affect. Normal behavior. Normal judgment and thought content. CARDIOVASCULAR: Normal heart rate noted, regular rhythm RESPIRATORY: Clear to auscultation bilaterally. Effort and breath sounds normal, no problems with respiration noted. BREASTS: Symmetric in size. No masses, tenderness, skin changes, nipple drainage, or lymphadenopathy bilaterally. Bilateral nipple piercing's  ABDOMEN: Soft, no distention noted.  No tenderness, rebound or guarding.  PELVIC: Normal appearing external genitalia and urethral meatus; normal appearing vaginal mucosa and  cervix.  No abnormal discharge noted.  Pap smear obtained.  Normal uterine size, no other palpable masses, no uterine or adnexal tenderness.  .   Assessment and Plan:  Women's annual routine gynecological examination  Screening for STD  (sexually transmitted disease) Need for hepatitis C screening test  Pap: not due Mammogram : n/a  Labs:cbc, iron, tsh, hep c, vaginal swab Refills:depo injection  Referral:none Discussed HPV vaccine, she is undecided. Pamphlet given with information. Pt instructed to let us know if she would like to receive it.  Routine preventative health maintenance measures emphasized. Please refer to After Visit Summary for other counseling recommendations.      Doreene Burke, CNM [facility],  [facility]

## 2020-06-23 NOTE — Patient Instructions (Signed)
Preventive Care 21-23 Years Old, Female Preventive care refers to lifestyle choices and visits with your health care provider that can promote health and wellness. This includes:  A yearly physical exam. This is also called an annual wellness visit.  Regular dental and eye exams.  Immunizations.  Screening for certain conditions.  Healthy lifestyle choices, such as: ? Eating a healthy diet. ? Getting regular exercise. ? Not using drugs or products that contain nicotine and tobacco. ? Limiting alcohol use. What can I expect for my preventive care visit? Physical exam Your health care provider may check your:  Height and weight. These may be used to calculate your BMI (body mass index). BMI is a measurement that tells if you are at a healthy weight.  Heart rate and blood pressure.  Body temperature.  Skin for abnormal spots. Counseling Your health care provider may ask you questions about your:  Past medical problems.  Family's medical history.  Alcohol, tobacco, and drug use.  Emotional well-being.  Home life and relationship well-being.  Sexual activity.  Diet, exercise, and sleep habits.  Work and work environment.  Access to firearms.  Method of birth control.  Menstrual cycle.  Pregnancy history. What immunizations do I need? Vaccines are usually given at various ages, according to a schedule. Your health care provider will recommend vaccines for you based on your age, medical history, and lifestyle or other factors, such as travel or where you work.   What tests do I need? Blood tests  Lipid and cholesterol levels. These may be checked every 5 years starting at age 20.  Hepatitis C test.  Hepatitis B test. Screening  Diabetes screening. This is done by checking your blood sugar (glucose) after you have not eaten for a while (fasting).  STD (sexually transmitted disease) testing, if you are at risk.  BRCA-related cancer screening. This may be  done if you have a family history of breast, ovarian, tubal, or peritoneal cancers.  Pelvic exam and Pap test. This may be done every 3 years starting at age 21. Starting at age 30, this may be done every 5 years if you have a Pap test in combination with an HPV test. Talk with your health care provider about your test results, treatment options, and if necessary, the need for more tests.   Follow these instructions at home: Eating and drinking  Eat a healthy diet that includes fresh fruits and vegetables, whole grains, lean protein, and low-fat dairy products.  Take vitamin and mineral supplements as recommended by your health care provider.  Do not drink alcohol if: ? Your health care provider tells you not to drink. ? You are pregnant, may be pregnant, or are planning to become pregnant.  If you drink alcohol: ? Limit how much you have to 0-1 drink a day. ? Be aware of how much alcohol is in your drink. In the [place] one drink equals one 12 oz bottle of beer (355 mL), one 5 oz glass of wine (148 mL), or one 1 oz glass of hard liquor (44 mL).   Lifestyle  Take daily care of your teeth and gums. Brush your teeth every morning and night with fluoride toothpaste. Floss one time each day.  Stay active. Exercise for at least 30 minutes 5 or more days each week.  Do not use any products that contain nicotine or tobacco, such as cigarettes, e-cigarettes, and chewing tobacco. If you need help quitting, ask your health care provider.  Do not   use drugs.  If you are sexually active, practice safe sex. Use a condom or other form of protection to prevent STIs (sexually transmitted infections).  If you do not wish to become pregnant, use a form of birth control. If you plan to become pregnant, see your health care provider for a prepregnancy visit.  Find healthy ways to cope with stress, such as: ? Meditation, yoga, or listening to music. ? Journaling. ? Talking to a trusted  person. ? Spending time with friends and family. Safety  Always wear your seat belt while driving or riding in a vehicle.  Do not drive: ? If you have been drinking alcohol. Do not ride with someone who has been drinking. ? When you are tired or distracted. ? While texting.  Wear a helmet and other protective equipment during sports activities.  If you have firearms in your house, make sure you follow all gun safety procedures.  Seek help if you have been physically or sexually abused. What's next?  Go to your health care provider once a year for an annual wellness visit.  Ask your health care provider how often you should have your eyes and teeth checked.  Stay up to date on all vaccines. This information is not intended to replace advice given to you by your health care provider. Make sure you discuss any questions you have with your health care provider. Document Revised: 10/26/2019 Document Reviewed: 11/08/2017 Elsevier Patient Education  2021 [employer]  

## 2020-06-24 LAB — CBC
Hematocrit: 44.5 % (ref 34.0–46.6)
Hemoglobin: 14.8 g/dL (ref 11.1–15.9)
MCH: 28.9 pg (ref 26.6–33.0)
MCHC: 33.3 g/dL (ref 31.5–35.7)
MCV: 87 fL (ref 79–97)
Platelets: 295 10*3/uL (ref 150–450)
RBC: 5.12 x10E6/uL (ref 3.77–5.28)
RDW: 12.9 % (ref 11.7–15.4)
WBC: 3.5 10*3/uL (ref 3.4–10.8)

## 2020-06-24 LAB — IRON: Iron: 88 ug/dL (ref 27–159)

## 2020-06-24 LAB — TSH: TSH: 1.18 u[IU]/mL (ref 0.450–4.500)

## 2020-06-25 LAB — CERVICOVAGINAL ANCILLARY ONLY
Bacterial Vaginitis (gardnerella): NEGATIVE
Candida Glabrata: NEGATIVE
Candida Vaginitis: POSITIVE — AB
Chlamydia: NEGATIVE
Comment: NEGATIVE
Comment: NEGATIVE
Comment: NEGATIVE
Comment: NEGATIVE
Comment: NEGATIVE
Comment: NORMAL
Neisseria Gonorrhea: NEGATIVE
Trichomonas: NEGATIVE

## 2020-06-26 LAB — SPECIMEN STATUS REPORT

## 2020-06-26 LAB — HEPATITIS C ANTIBODY: Hep C Virus Ab: <0.1 s/co ratio (ref 0.0–0.9)

## 2020-06-28 ENCOUNTER — Other Ambulatory Visit: Payer: Self-pay | Admitting: Certified Nurse Midwife

## 2020-06-28 MED ORDER — FLUCONAZOLE 150 MG PO TABS: 150.0000 mg | ORAL_TABLET | Freq: Once | ORAL | 0 refills | Status: AC

## 2021-01-10 ENCOUNTER — Ambulatory Visit: Payer: Self-pay

## 2021-01-17 ENCOUNTER — Ambulatory Visit: Payer: Self-pay

## 2021-06-27 ENCOUNTER — Encounter: Payer: BC Managed Care – PPO | Admitting: Certified Nurse Midwife

## 2021-09-02 ENCOUNTER — Telehealth: Payer: Self-pay | Admitting: Certified Nurse Midwife

## 2021-09-02 ENCOUNTER — Other Ambulatory Visit: Payer: Self-pay

## 2021-09-02 ENCOUNTER — Ambulatory Visit: Payer: Self-pay

## 2021-09-02 MED ORDER — MEDROXYPROGESTERONE ACETATE 150 MG/ML IM SUSY: 150.0000 mg | PREFILLED_SYRINGE | INTRAMUSCULAR | 3 refills | Status: DC

## 2021-09-02 NOTE — Telephone Encounter (Signed)
Pt called states that she gets her depo injections at home- she does not have any refills and will be due for a shot before next apt- which is in August. Please advise.

## 2021-10-25 ENCOUNTER — Encounter: Payer: BC Managed Care – PPO | Admitting: Certified Nurse Midwife

## 2022-09-05 ENCOUNTER — Other Ambulatory Visit: Payer: Self-pay | Admitting: Certified Nurse Midwife

## 2022-09-18 ENCOUNTER — Telehealth: Payer: Self-pay | Admitting: Certified Nurse Midwife

## 2022-09-18 NOTE — Telephone Encounter (Signed)
The patient request refill on Depo. Needs annual exams scheduled. I contact the patient via phone. I left message for the patient to call back to confirm scheduling of Annual with Leodis Liverpool on Wednesday, 7/24 at 8:35 am.

## 2022-09-20 ENCOUNTER — Ambulatory Visit: Payer: BC Managed Care – PPO

## 2022-09-20 NOTE — Progress Notes (Deleted)
    NURSE VISIT NOTE  Subjective:    Patient ID: Savannah Carson, female    DOB: 02/05/1996, 27 y.o.   MRN: [id number]  HPI  Patient is a 27 y.o. G62P1001 female who presents for depo provera injection.   Objective:    There were no vitals taken for this visit.  Last Annual: 06/23/20. Last pap: ***. Last Depo-Provera: ***. Side Effects if any: {NONE:21772}***. Serum HCG indicated? {YES/NO:21197}. Depo-Provera 150 mg IM given by: {AOB Clinical BJYNW:29562}. Site: {AOB INJ [id number]  Lab Review  @THIS  VISIT ONLY@  Assessment:   No diagnosis found.   Plan:   Next appointment due between *** and ***.    Cornelius Moras, CMA

## 2022-09-21 ENCOUNTER — Telehealth: Payer: Self-pay

## 2022-09-21 NOTE — Telephone Encounter (Signed)
Reached out to pt to reschedule Annual Exam that was scheduled on Friday, July 12 at 1:15 with [facility].  There has been a schedule change and the appt needs to be rescheduled.  The next available annual appt is going to be with Noelle Penner on July 23 at 9:15 or 1:35.

## 2022-09-22 ENCOUNTER — Ambulatory Visit: Payer: BC Managed Care – PPO

## 2022-09-22 NOTE — Telephone Encounter (Signed)
Pt has been scheduled with Savannah Carson on 09/25/2022 at 9:55.

## 2022-09-25 ENCOUNTER — Ambulatory Visit: Payer: BC Managed Care – PPO | Admitting: Obstetrics

## 2022-09-25 ENCOUNTER — Other Ambulatory Visit (HOSPITAL_COMMUNITY)
Admission: RE | Admit: 2022-09-25 | Discharge: 2022-09-25 | Disposition: A | Discharge status: Home / Self Care | Payer: BC Managed Care – PPO | Source: Ambulatory Visit | Attending: Obstetrics | Admitting: Obstetrics

## 2022-09-25 ENCOUNTER — Encounter: Payer: Self-pay | Admitting: Obstetrics

## 2022-09-25 VITALS — BP 118/71 | HR 78 | Ht 69.0 in | Wt 177.0 lb

## 2022-09-25 DIAGNOSIS — Z124 Encounter for screening for malignant neoplasm of cervix: Secondary | ICD-10-CM | POA: Insufficient documentation

## 2022-09-25 DIAGNOSIS — F419 Anxiety disorder, unspecified: Secondary | ICD-10-CM

## 2022-09-25 DIAGNOSIS — Z3042 Encounter for surveillance of injectable contraceptive: Secondary | ICD-10-CM | POA: Diagnosis not present

## 2022-09-25 DIAGNOSIS — Z3202 Encounter for pregnancy test, result negative: Secondary | ICD-10-CM | POA: Diagnosis not present

## 2022-09-25 DIAGNOSIS — Z113 Encounter for screening for infections with a predominantly sexual mode of transmission: Secondary | ICD-10-CM | POA: Diagnosis not present

## 2022-09-25 DIAGNOSIS — Z01419 Encounter for gynecological examination (general) (routine) without abnormal findings: Secondary | ICD-10-CM | POA: Diagnosis not present

## 2022-09-25 LAB — POCT URINE PREGNANCY: Preg Test, Ur: NEGATIVE

## 2022-09-25 MED ORDER — MEDROXYPROGESTERONE ACETATE 150 MG/ML IM SUSY
150.0000 mg | PREFILLED_SYRINGE | Freq: Once | INTRAMUSCULAR | Status: AC
Start: 2022-09-25 — End: 2022-09-25
  Administered 2022-09-25: 150 mg via INTRAMUSCULAR

## 2022-09-25 MED ORDER — HYDROXYZINE HCL 10 MG PO TABS: 10.0000 mg | ORAL_TABLET | Freq: Three times a day (TID) | ORAL | 0 refills | Status: AC | PRN

## 2022-09-25 NOTE — Progress Notes (Signed)
ANNUAL GYNECOLOGICAL EXAM  SUBJECTIVE  HPI  Savannah Carson is a 27 y.o.-year-old G1P1001 who presents for an annual gynecological exam today.  She denies pelvic pain, dyspareunia, abnormal vaginal bleeding or discharge, and UTI symptoms. She is sexually active with men and women. She feels her anxiety is fairly well-controlled except at night time. She has difficulty sleeping d/t her mind racing and night sweats.   Medical/Surgical History Past Medical History:  Diagnosis Date   Anxiety    Asthma    No past surgical history on file.  Social History Lives with boyfriend. 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 Patient has had an injection. Last Pap: 05/2019, NILM Contraception: Depo  Prevention Mammogram: at 42 Colonoscopy: at 47  Current Medications Outpatient Medications Prior to Visit  Medication Sig   albuterol (PROVENTIL HFA;VENTOLIN HFA) 108 (90 Base) MCG/ACT inhaler Inhale 2 puffs into the lungs every 4 (four) hours as needed for wheezing or shortness of breath.    escitalopram (LEXAPRO) 10 MG tablet Take 10 mg by mouth daily.   medroxyPROGESTERone Acetate 150 MG/ML SUSY Inject 1 mL (150 mg total) as directed every 3 (three) months.   No facility-administered medications prior to visit.      Upstream - 09/25/22 1003       Pregnancy Intention Screening   Does the patient want to become pregnant in the next year? No    Does the patient's partner want to become pregnant in the next year? No    Would the patient like to discuss contraceptive options today? No      Contraception Wrap Up   Current Method Hormonal Injection    End Method  Hormonal Injection    Contraception Counseling Provided No    How was the end contraceptive method provided? N/A            The pregnancy intention screening data noted above was reviewed. Potential methods of contraception were discussed. The patient elected to proceed with Hormonal Injection.   ROS Constitutional: Denied constitutional symptoms, recent illness, fatigue, fever, insomnia and weight loss.  Eyes: Denied eye symptoms, eye pain, photophobia, vision change and visual disturbance.  Ears/Nose/Throat/Neck: Denied ear, nose, throat or neck symptoms, hearing loss, nasal discharge, sinus congestion and sore throat.  Cardiovascular: Denied cardiovascular symptoms, arrhythmia, chest pain/pressure, edema, exercise intolerance, orthopnea and palpitations.  Respiratory: Denied pulmonary symptoms, asthma, pleuritic pain, productive sputum, cough, dyspnea and wheezing.  Gastrointestinal: Denied, gastro-esophageal reflux, melena, nausea and vomiting.  Genitourinary: Denied genitourinary symptoms including symptomatic vaginal discharge, pelvic relaxation issues, and urinary complaints.  Musculoskeletal: Denied musculoskeletal symptoms, stiffness, swelling, muscle weakness and myalgia.  Dermatologic: Denied dermatology symptoms, rash and scar.  Neurologic: Denied neurology symptoms, dizziness, headache, neck pain and syncope.  Psychiatric: +anxiety/insomnia  Endocrine: Denied endocrine symptoms including hot flashes and night sweats.    OBJECTIVE  BP 118/71   Pulse 78   Ht 5\' 9"  (  1.753 m)   Wt 177 lb (80.3 kg)   BMI 26.14 kg/m    Physical examination General NAD, Conversant  HEENT Atraumatic; Op clear with mmm.  Normo-cephalic. Pupils reactive. Anicteric sclerae  Thyroid/Neck Smooth without nodularity or enlargement. Normal ROM.  Neck Supple.  Skin No rashes, lesions or ulceration. Normal palpated skin turgor. No nodularity.  Breasts: No masses or discharge.  Symmetric.  No  axillary adenopathy.  Lungs: Clear to auscultation.No rales or wheezes. Normal Respiratory effort, no retractions.  Heart: NSR.  No murmurs or rubs appreciated. No peripheral edema  Abdomen: Soft.  Non-tender.  No masses.  No HSM. No hernia  Extremities: Moves all appropriately.  Normal ROM for age. No lymphadenopathy.  Neuro: Oriented to PPT.  Normal mood. Normal affect.     Pelvic:   Vulva: Normal appearance.  No lesions.  Vagina: No lesions or abnormalities noted.  Support: Normal pelvic support.  Urethra No masses tenderness or scarring.  Meatus Normal size without lesions or prolapse.  Cervix: Normal appearance.  No lesions.  Anus: Normal exam.  No lesions.  Perineum: Normal exam.  No lesions.    ASSESSMENT  1) Annual exam 2) Due for Pap 3) Insomnia d/t anxiety  PLAN 1) Physical exam as noted. Discussed healthy lifestyle choices and preventive care. Encouraged vaping reduction/cessation. She is not quite ready to quit but considering. STI screening done. 2) Pap collected. F/u based on results. 3) Discussed nighttime meditation, white noise, relaxation. Rx for hydroxyzine PRN sent to pharmacy.  Return in one year for annual exam or as needed for concerns.   Guadlupe Spanish, CNM

## 2022-09-26 LAB — HEPATITIS C ANTIBODY: Hep C Virus Ab: NONREACTIVE

## 2022-09-26 LAB — RPR: RPR Ser Ql: NONREACTIVE

## 2022-09-26 LAB — HEPATITIS B SURFACE ANTIBODY,QUALITATIVE: Hep B Surface Ab, Qual: NONREACTIVE

## 2022-09-26 LAB — HEPATITIS B SURFACE ANTIGEN: Hepatitis B Surface Ag: NEGATIVE

## 2022-09-26 LAB — HIV ANTIBODY (ROUTINE TESTING W REFLEX): HIV Screen 4th Generation wRfx: NONREACTIVE

## 2022-09-28 ENCOUNTER — Encounter: Payer: Self-pay | Admitting: Obstetrics

## 2022-09-28 ENCOUNTER — Other Ambulatory Visit: Payer: Self-pay | Admitting: Obstetrics

## 2022-09-28 LAB — CYTOLOGY - PAP
Chlamydia: NEGATIVE
Comment: NEGATIVE
Comment: NEGATIVE
Comment: NORMAL
Diagnosis: NEGATIVE
Neisseria Gonorrhea: NEGATIVE
Trichomonas: POSITIVE — AB

## 2022-09-28 MED ORDER — METRONIDAZOLE 500 MG PO TABS: 500.0000 mg | ORAL_TABLET | Freq: Two times a day (BID) | ORAL | 0 refills | Status: DC

## 2022-09-28 NOTE — Progress Notes (Signed)
+  trichomonas. Rx for metronidazole 500 mg PO BID x 7 days sent to pharmacy. Diana notified via MyChart and informed that partner(s) should be treated.  M. Chryl Heck, CNM

## 2022-09-29 ENCOUNTER — Other Ambulatory Visit: Payer: Self-pay | Admitting: Obstetrics

## 2022-09-29 MED ORDER — ONDANSETRON HCL 4 MG PO TABS: 8.0000 mg | ORAL_TABLET | Freq: Three times a day (TID) | ORAL | 0 refills | Status: AC | PRN

## 2022-09-29 MED ORDER — TINIDAZOLE 500 MG PO TABS: 2.0000 g | ORAL_TABLET | Freq: Once | ORAL | 0 refills | Status: AC

## 2022-10-04 ENCOUNTER — Ambulatory Visit: Payer: BC Managed Care – PPO | Admitting: Certified Nurse Midwife

## 2022-12-18 ENCOUNTER — Ambulatory Visit: Payer: BC Managed Care – PPO

## 2022-12-18 VITALS — BP 118/72 | HR 82 | Ht 69.0 in | Wt 179.0 lb

## 2022-12-18 DIAGNOSIS — Z3042 Encounter for surveillance of injectable contraceptive: Secondary | ICD-10-CM

## 2022-12-18 MED ORDER — MEDROXYPROGESTERONE ACETATE 150 MG/ML IM SUSY
150.0000 mg | PREFILLED_SYRINGE | Freq: Once | INTRAMUSCULAR | Status: AC
Start: 2022-12-18 — End: 2022-12-18
  Administered 2022-12-18: 150 mg via INTRAMUSCULAR

## 2022-12-18 NOTE — Progress Notes (Signed)
    NURSE VISIT NOTE  Subjective:    Patient ID: Savannah Carson, female    DOB: 01/14/1996, 27 y.o.   MRN: [id number]  HPI  Patient is a 27 y.o. G24P1001 female who presents for depo provera injection.   Objective:    BP 118/72   Pulse 82   Ht 5\' 9"  (1.753 m)   Wt 179 lb (81.2 kg)   BMI 26.43 kg/m   Last Annual: 09/25/22. Last pap: 09/25/22. Last Depo-Provera: 09/25/22. Side Effects if any: none. Serum HCG indicated? No . Depo-Provera 150 mg IM given by: Beverely Pace, CMA. Site: Right Deltoid    Assessment:   1. Encounter for Depo-Provera contraception      Plan:   Next appointment due between Dec-23 -Jan-6     Loney Laurence, CMA

## 2023-02-26 ENCOUNTER — Emergency Department: Payer: BC Managed Care – PPO

## 2023-02-26 ENCOUNTER — Emergency Department
Admission: EM | Admit: 2023-02-26 | Discharge: 2023-02-26 | Discharge status: Against Medical Advice | Payer: BC Managed Care – PPO | Source: Home / Self Care

## 2023-02-26 ENCOUNTER — Other Ambulatory Visit: Payer: Self-pay

## 2023-02-26 DIAGNOSIS — R1030 Lower abdominal pain, unspecified: Secondary | ICD-10-CM | POA: Diagnosis present

## 2023-02-26 LAB — CBC
HCT: 40.2 % (ref 36.0–46.0)
Hemoglobin: 13.8 g/dL (ref 12.0–15.0)
MCH: 29.4 pg (ref 26.0–34.0)
MCHC: 34.3 g/dL (ref 30.0–36.0)
MCV: 85.7 fL (ref 80.0–100.0)
Platelets: 301 10*3/uL (ref 150–400)
RBC: 4.69 MIL/uL (ref 3.87–5.11)
RDW: 12.9 % (ref 11.5–15.5)
WBC: 4.2 10*3/uL (ref 4.0–10.5)
nRBC: 0 % (ref 0.0–0.2)

## 2023-02-26 LAB — COMPREHENSIVE METABOLIC PANEL
ALT: 14 U/L (ref 0–44)
AST: 19 U/L (ref 15–41)
Albumin: 4.1 g/dL (ref 3.5–5.0)
Alkaline Phosphatase: 53 U/L (ref 38–126)
Anion gap: 7 (ref 5–15)
BUN: 15 mg/dL (ref 6–20)
CO2: 23 mmol/L (ref 22–32)
Calcium: 8.8 mg/dL — ABNORMAL LOW (ref 8.9–10.3)
Chloride: 105 mmol/L (ref 98–111)
Creatinine, Ser: 0.89 mg/dL (ref 0.44–1.00)
GFR, Estimated: >60 mL/min (ref >60)
Glucose, Bld: 101 mg/dL — ABNORMAL HIGH (ref 70–99)
Potassium: 3.4 mmol/L — ABNORMAL LOW (ref 3.5–5.1)
Sodium: 135 mmol/L (ref 135–145)
Total Bilirubin: 0.7 mg/dL (ref <1.2)
Total Protein: 7.7 g/dL (ref 6.5–8.1)

## 2023-02-26 LAB — POC URINE PREG, ED: Preg Test, Ur: NEGATIVE

## 2023-02-26 LAB — URINALYSIS, ROUTINE W REFLEX MICROSCOPIC
Bilirubin Urine: NEGATIVE
Glucose, UA: NEGATIVE mg/dL
Hgb urine dipstick: NEGATIVE
Ketones, ur: NEGATIVE mg/dL
Leukocytes,Ua: NEGATIVE
Nitrite: NEGATIVE
Protein, ur: NEGATIVE mg/dL
Specific Gravity, Urine: 1.024 (ref 1.005–1.030)
pH: 6 (ref 5.0–8.0)

## 2023-02-26 LAB — LIPASE, BLOOD: Lipase: 29 U/L (ref 11–51)

## 2023-02-26 LAB — ABO/RH: ABO/RH(D): A POS

## 2023-02-26 NOTE — ED Provider Triage Note (Signed)
Emergency Medicine Provider Triage Evaluation Note  Savannah Carson , a 27 y.o. female  was evaluated in triage.  Pt complains of abdominal pain, intermittent bleeding for period.  Last episode less than 3 months.  However she was 3-1/2 weeks getting her Depo they did not check her for pregnancy prior to giving her the Depo shot.  Review of Systems  Positive:  Negative:   Physical Exam  BP 139/79   Pulse 66   Temp 98.6 F (37 C) (Oral)   Resp 18   Ht 5\' 9"  (1.753 m)   Wt 79.8 kg   SpO2 100%   BMI 25.99 kg/m  Gen:   Awake, no distress   Resp:  Normal effort  MSK:   Moves extremities without difficulty  Other:    Medical Decision Making  Medically screening exam initiated at 6:10 PM.  Appropriate orders placed.  Savannah Carson was informed that the remainder of the evaluation will be completed by another provider, this initial triage assessment does not replace that evaluation, and the importance of remaining in the ED until their evaluation is complete.     Faythe Ghee, PA-C 02/26/23 1811

## 2023-02-26 NOTE — ED Triage Notes (Signed)
Pt has lower ABD pain with intermittent bleeding for a month (slightly heavier than a period), palpitations, and nausea. Pt on Depo since 2018. Sent from [facility].

## 2023-02-27 ENCOUNTER — Emergency Department
Admission: EM | Admit: 2023-02-27 | Discharge: 2023-02-27 | Disposition: A | Discharge status: Home / Self Care | Payer: BC Managed Care – PPO | Attending: Emergency Medicine | Admitting: Emergency Medicine

## 2023-02-27 DIAGNOSIS — R1030 Lower abdominal pain, unspecified: Secondary | ICD-10-CM

## 2023-02-27 NOTE — ED Provider Notes (Signed)
[facility] Provider Note    Event Date/Time   First MD Initiated Contact with Patient 02/27/23 [phone]     (approximate)   History   Abdominal Pain   HPI  Savannah Carson is a 27 y.o. female who presents to the ED for evaluation of Abdominal Pain   Patient presents with a friend for evaluation of about 1 month of intermittent lower abdominal/pelvic cramping and vaginal bleeding/spotting.  She is on Depo shots, last injection was 2 months ago.  No fevers, changes to p.o. intake, N/V/D, other vaginal discharge   Physical Exam   Triage Vital Signs: ED Triage Vitals  Encounter Vitals Group     BP 02/26/23 1809 139/79     Systolic BP Percentile --      Diastolic BP Percentile --      Pulse Rate 02/26/23 1809 66     Resp 02/26/23 1809 18     Temp 02/26/23 1809 98.6 F (37 C)     Temp Source 02/26/23 1809 Oral     SpO2 02/26/23 1809 100 %     Weight 02/26/23 1807 176 lb (79.8 kg)     Height 02/26/23 1807 5\' 9"  (1.753 m)     Head Circumference --      Peak Flow --      Pain Score 02/26/23 1807 3     Pain Loc --      Pain Education --      Exclude from Growth Chart --     Most recent vital signs: Vitals:   02/26/23 1809 02/27/23 0230  BP: 139/79 (!) 113/59  Pulse: 66 74  Resp: 18 18  Temp: 98.6 F (37 C)   SpO2: 100% 100%    General: Awake, no distress.  CV:  Good peripheral perfusion.  Resp:  Normal effort.  Abd:  No distention.  Soft and benign throughout MSK:  No deformity noted.  Neuro:  No focal deficits appreciated. Other:     ED Results / Procedures / Treatments   Labs (all labs ordered are listed, but only abnormal results are displayed) Labs Reviewed  COMPREHENSIVE METABOLIC PANEL - Abnormal; Notable for the following components:      Result Value   Potassium 3.4 (*)    Glucose, Bld 101 (*)    Calcium 8.8 (*)    All other components within normal limits  URINALYSIS, ROUTINE W REFLEX MICROSCOPIC - Abnormal; Notable  for the following components:   Color, Urine YELLOW (*)    APPearance HAZY (*)    All other components within normal limits  LIPASE, BLOOD  CBC  POC URINE PREG, ED  ABO/RH    EKG   RADIOLOGY Pelvic ultrasound interpreted by me without evidence of acute pathology  Official radiology report(s): US PELVIC COMPLETE WITH TRANSVAGINAL Result Date: 02/27/2023 CLINICAL DATA:  Intermittent bleeding for 1 month. EXAM: TRANSABDOMINAL AND TRANSVAGINAL ULTRASOUND OF PELVIS TECHNIQUE: Both transabdominal and transvaginal ultrasound examinations of the pelvis were performed. Transabdominal technique was performed for global imaging of the pelvis including uterus, ovaries, adnexal regions, and pelvic cul-de-sac. It was necessary to proceed with endovaginal exam following the transabdominal exam to visualize the uterus and adnexa. COMPARISON:  None Available. FINDINGS: Uterus Measurements: 7.6 x 3.0 x 4.2 cm = volume: 51 mL. No fibroids or other mass visualized. Endometrium Thickness: 4 mm. Small amount of fluid in the endometrial canal. No focal abnormality Right ovary Measurements: 2.0 x 1.9 x 2.3 cm = volume: 7  mL. Normal appearance/no adnexal mass. Left ovary Measurements: 2.8 x 1.8 x 2.3 cm = volume: 6 mL. Normal appearance/no adnexal mass. Other findings No abnormal free fluid. IMPRESSION: Small amount of fluid in the endometrial canal. Otherwise unremarkable pelvic ultrasound. If bleeding remains unresponsive to hormonal or medical therapy, sonohysterogram should be considered for focal lesion work-up. (Ref: Radiological Reasoning: Algorithmic Workup of Abnormal Vaginal Bleeding with Endovaginal Sonography and Sonohysterography. AJR 2008; 629:B28-41) Electronically Signed   By: Minerva Fester M.D.   On: 02/27/2023 01:31    PROCEDURES and INTERVENTIONS:  Procedures  Medications - No data to display   IMPRESSION / MDM / ASSESSMENT AND PLAN / ED COURSE  I reviewed the triage vital signs and the  nursing notes.  Differential diagnosis includes, but is not limited to, medication reaction, pregnancy, PID or STI, blood loss anemia  {Patient presents with symptoms of an acute illness or injury that is potentially life-threatening.  Patient presents with chronic pelvic cramping and spotting in the setting of Depo injection suitable for outpatient management with OB/GYN follow-up.  Normal vital signs and exam.  Blood work with normal hemoglobin, lipase, metabolic panel and urinalysis.  She is not pregnant.  Suitable for GYN follow-up      FINAL CLINICAL IMPRESSION(S) / ED DIAGNOSES   Final diagnoses:  Lower abdominal pain     Rx / DC Orders   ED Discharge Orders     None        Note:  This document was prepared using Dragon voice recognition software and may include unintentional dictation errors.   Delton Prairie, MD 02/27/23 [id number]

## 2023-02-27 NOTE — Discharge Instructions (Signed)
Please take Tylenol and ibuprofen/Advil for your pain.  It is safe to take them together, or to alternate them every few hours.  Take up to 1000mg of Tylenol at a time, up to 4 times per day.  Do not take more than 4000 mg of Tylenol in 24 hours.  For ibuprofen, take 400-600 mg, 3 - 4 times per day.  

## 2023-03-21 ENCOUNTER — Other Ambulatory Visit (HOSPITAL_COMMUNITY)
Admission: RE | Admit: 2023-03-21 | Discharge: 2023-03-21 | Disposition: A | Discharge status: Home / Self Care | Payer: Self-pay | Source: Ambulatory Visit | Attending: Certified Nurse Midwife | Admitting: Certified Nurse Midwife

## 2023-03-21 ENCOUNTER — Ambulatory Visit (INDEPENDENT_AMBULATORY_CARE_PROVIDER_SITE_OTHER): Payer: 59

## 2023-03-21 VITALS — BP 112/66 | HR 68 | Ht 69.0 in | Wt 182.8 lb

## 2023-03-21 DIAGNOSIS — N898 Other specified noninflammatory disorders of vagina: Secondary | ICD-10-CM | POA: Diagnosis not present

## 2023-03-21 DIAGNOSIS — Z3042 Encounter for surveillance of injectable contraceptive: Secondary | ICD-10-CM | POA: Diagnosis not present

## 2023-03-21 DIAGNOSIS — Z3202 Encounter for pregnancy test, result negative: Secondary | ICD-10-CM

## 2023-03-21 LAB — POCT URINALYSIS DIPSTICK
Bilirubin, UA: NEGATIVE
Blood, UA: NEGATIVE
Glucose, UA: NEGATIVE
Ketones, UA: NEGATIVE
Leukocytes, UA: NEGATIVE
Nitrite, UA: NEGATIVE
Protein, UA: NEGATIVE
Spec Grav, UA: 1.015 (ref 1.010–1.025)
Urobilinogen, UA: 0.2 U/dL
pH, UA: 6 (ref 5.0–8.0)

## 2023-03-21 LAB — POCT URINE PREGNANCY: Preg Test, Ur: NEGATIVE

## 2023-03-21 MED ORDER — MEDROXYPROGESTERONE ACETATE 150 MG/ML IM SUSP
150.0000 mg | Freq: Once | INTRAMUSCULAR | Status: AC
  Administered 2023-03-21: 150 mg via INTRAMUSCULAR

## 2023-03-21 NOTE — Progress Notes (Addendum)
    NURSE VISIT NOTE  Subjective:    Patient ID: Savannah Carson, female    DOB: 1995/05/14, 28 y.o.   MRN: [id number]  HPI  Patient is a 28 y.o. G17P1001 female who presents for depo provera  injection, patient also reports concern today of possible vaginal infection or urinary infection. Patient reports irritation < 5 days.    Objective:    BP 112/66   Pulse 68   Ht 5' 9 (1.753 m)   Wt 182 lb 12.8 oz (82.9 kg)   BMI 26.99 kg/m   Last Annual: 09/25/22. Last pap: 09/25/22. Last Depo-Provera : 12/18/2022. Side Effects if any: none. Serum HCG indicated? No . Depo-Provera  150 mg IM given by: Rollo Maxin, CMA. Site: Right deltoid ( patient requested)   Lab Review  Urine HCG ordered today Results for orders placed or performed in visit on 03/21/23 (from the past 24 hours)  POCT urine pregnancy     Status: Normal   Collection Time: 03/21/23  2:27 PM  Result Value Ref Range   Preg Test, Ur Negative Negative  POCT urinalysis dipstick     Status: Normal   Collection Time: 03/21/23  2:34 PM  Result Value Ref Range   Color, UA yellow    Clarity, UA clear    Glucose, UA Negative Negative   Bilirubin, UA negative    Ketones, UA negative    Spec Grav, UA 1.015 1.010 - 1.025   Blood, UA negative    pH, UA 6.0 5.0 - 8.0   Protein, UA Negative Negative   Urobilinogen, UA 0.2 0.2 or 1.0 E.U./dL   Nitrite, UA negative    Leukocytes, UA Negative Negative   Appearance     Odor       Assessment:   1. Encounter for surveillance of injectable contraceptive   2. Vaginal irritation      Plan:   Next appointment due between 06/06/23 and 06/20/23.    Rollo JINNY Maxin, CMA

## 2023-03-21 NOTE — Patient Instructions (Signed)

## 2023-03-22 LAB — CERVICOVAGINAL ANCILLARY ONLY
Bacterial Vaginitis (gardnerella): POSITIVE — AB
Candida Glabrata: NEGATIVE
Candida Vaginitis: POSITIVE — AB
Chlamydia: NEGATIVE
Comment: NEGATIVE
Comment: NEGATIVE
Comment: NEGATIVE
Comment: NEGATIVE
Comment: NEGATIVE
Comment: NORMAL
Neisseria Gonorrhea: NEGATIVE
Trichomonas: NEGATIVE

## 2023-03-23 ENCOUNTER — Other Ambulatory Visit: Payer: Self-pay | Admitting: Obstetrics and Gynecology

## 2023-03-23 ENCOUNTER — Encounter: Payer: Self-pay | Admitting: Obstetrics and Gynecology

## 2023-03-23 DIAGNOSIS — N760 Acute vaginitis: Secondary | ICD-10-CM

## 2023-03-23 DIAGNOSIS — B3731 Acute candidiasis of vulva and vagina: Secondary | ICD-10-CM

## 2023-03-23 LAB — URINE CULTURE

## 2023-03-23 MED ORDER — TERCONAZOLE 0.4 % VA CREA: 1.0000 | TOPICAL_CREAM | Freq: Every day | VAGINAL | 0 refills | Status: AC

## 2023-03-23 MED ORDER — METRONIDAZOLE 500 MG PO TABS: 500.0000 mg | ORAL_TABLET | Freq: Two times a day (BID) | ORAL | 0 refills | Status: AC

## 2023-03-26 ENCOUNTER — Other Ambulatory Visit: Payer: Self-pay

## 2023-03-26 DIAGNOSIS — B9689 Other specified bacterial agents as the cause of diseases classified elsewhere: Secondary | ICD-10-CM

## 2023-03-26 MED ORDER — METRONIDAZOLE 0.75 % VA GEL: 1.0000 | Freq: Every day | VAGINAL | 1 refills | Status: AC

## 2023-04-09 NOTE — Telephone Encounter (Signed)
Per pharmacy rx was received. It is/has been ready for pick up for four days.

## 2023-04-09 NOTE — Telephone Encounter (Signed)
Patient states she wasn't able to pick up metrogel. Pharmacy advised her the rx was not received. She states she tried to pick it up on 03/28/23 (two days after it was sent in). Advised EPIC shows receipt confirmed by pharmacy 03/26/23 @10 :40 am. I will contact pharmacy to clarify and call patient back.

## 2023-04-09 NOTE — Telephone Encounter (Signed)
Left voicemail to notify patient.

## 2023-04-09 NOTE — Telephone Encounter (Signed)
Called pharmacy. Phone rang for 5 minutes without an answer after being transferred to the provider line. Will try again later.

## 2023-06-13 ENCOUNTER — Ambulatory Visit: Payer: Self-pay

## 2023-06-13 VITALS — BP 118/78 | HR 69 | Ht 69.0 in | Wt 167.4 lb

## 2023-06-13 DIAGNOSIS — Z3042 Encounter for surveillance of injectable contraceptive: Secondary | ICD-10-CM

## 2023-06-13 MED ORDER — MEDROXYPROGESTERONE ACETATE 150 MG/ML IM SUSP
150.0000 mg | Freq: Once | INTRAMUSCULAR | Status: AC
Start: 2023-06-13 — End: 2023-06-13
  Administered 2023-06-13: 150 mg via INTRAMUSCULAR

## 2023-06-13 NOTE — Patient Instructions (Signed)

## 2023-06-13 NOTE — Progress Notes (Addendum)
    NURSE VISIT NOTE  Subjective:    Patient ID: Savannah Carson, female    DOB: Apr 28, 1995, 28 y.o.   MRN: [id number]  HPI  Patient is a 28 y.o. G3P1001 female who presents for depo provera injection.   Objective:    BP 118/78   Pulse 69   Ht 5\' 9"  (1.753 m)   Wt 167 lb 6.4 oz (75.9 kg)   BMI 24.72 kg/m   Last Annual: 09/25/2022. Last pap: 09/25/2022. Last Depo-Provera: 03/21/2023. Side Effects if any: none. Serum HCG indicated? No . Depo-Provera 150 mg IM given by: Sheliah Hatch, CMA. Site: Right Ventrogluteal  Lab Review    Assessment:   1. Encounter for surveillance of injectable contraceptive      Plan:   Next appointment due between 08/29/23 and 09/12/23.    Fonda Kinder, CMA

## 2023-07-18 ENCOUNTER — Ambulatory Visit: Admitting: Certified Nurse Midwife

## 2023-07-19 ENCOUNTER — Encounter: Payer: Self-pay | Admitting: Certified Nurse Midwife

## 2023-07-19 ENCOUNTER — Ambulatory Visit: Admitting: Certified Nurse Midwife

## 2023-07-19 VITALS — BP 120/73 | HR 74 | Ht 69.0 in | Wt 166.7 lb

## 2023-07-19 DIAGNOSIS — N926 Irregular menstruation, unspecified: Secondary | ICD-10-CM | POA: Insufficient documentation

## 2023-07-19 DIAGNOSIS — N939 Abnormal uterine and vaginal bleeding, unspecified: Secondary | ICD-10-CM

## 2023-07-19 MED ORDER — NORETHINDRONE 0.35 MG PO TABS
1.0000 | ORAL_TABLET | Freq: Every day | ORAL | 3 refills | Status: DC
Start: 2023-07-19 — End: 2023-10-12

## 2023-07-19 NOTE — Progress Notes (Signed)
    GYNECOLOGY PROGRESS NOTE  Subjective:    Patient ID: Savannah Carson, female    DOB: [birth year], 28 y.o.   MRN: [id number]  HPI  Patient is a 28 y.o. G61P1001 female who presents for evaluation of bleeding while on depo. She is current on her last shot which she received in April. She has been on depo for 6 years and has experience more irregular bleeding in the last 3-4 months, sometimes lasting 3 weeks.    The following portions of the patient's history were reviewed and updated as appropriate: allergies, current medications, past family history, past medical history, past social history, past surgical history, and problem list.  Review of Systems Pertinent items are noted in HPI.   Objective:   Blood pressure 120/73, pulse 74, height 5\' 9"  (1.753 m), weight 166 lb 11.2 oz (75.6 kg). Body mass index is 24.62 kg/m. General appearance: alert Pelvic: deferred  Assessment:   1. Irregular bleeding      Plan:   Irregular bleeding -Reviewed all options for alternative contraception that may work better than Depo for her bleeding concerns. Information given to visit [email or web address] so she can do additional research -Will add COCs daily to see if there is improvement.  -RTC in 2 months if she desires LARC placement.

## 2023-07-31 ENCOUNTER — Encounter: Payer: Self-pay | Admitting: Nurse Practitioner

## 2023-07-31 ENCOUNTER — Ambulatory Visit: Payer: Self-pay | Admitting: Nurse Practitioner

## 2023-07-31 DIAGNOSIS — Z113 Encounter for screening for infections with a predominantly sexual mode of transmission: Secondary | ICD-10-CM

## 2023-07-31 LAB — WET PREP FOR TRICH, YEAST, CLUE
Clue Cell Exam: NEGATIVE
Trichomonas Exam: NEGATIVE
Yeast Exam: NEGATIVE

## 2023-08-04 LAB — GONOCOCCUS CULTURE

## 2023-08-12 NOTE — Progress Notes (Signed)
 [facility] Department STI clinic [address], [address] [place] [address] Main phone: [phone]  STI screening visit  Subjective:  Savannah Carson is a 28 y.o. female being seen today for an STI screening visit. The patient reports they do have symptoms.  Patient reports that they do not desire a pregnancy in the next year.   They reported they are not interested in discussing contraception today.    No LMP recorded (lmp unknown). Patient has had an injection.  Patient has the following medical conditions:  Patient Active Problem List   Diagnosis Date Noted   Irregular bleeding 07/19/2023   Asthma without status asthmaticus 06/23/2020   Allergic rhinitis 06/23/2020   Marijuana use 02/04/2019   Anxiety 02/04/2019   Chief Complaint  Patient presents with   SEXUALLY TRANSMITTED DISEASE    HPI Patient is a pleasant 28 y.o. female who presents to the office today requesting symptomatic STI testing. Patient reports symptoms include a milky, malodorous discharge per patient. She states she was treated for BV a few months ago at [facility] and she is using boric acid suppositories about every other night.  Patient indicates 1 female partner in the last 2 months. She reports practicing vaginal and oral sex and never uses condoms. Patient indicates STI history of trichomoniasis. Patient reports last sex was 4 days ago. She indicates use of hormonal injection as contraception method. Patient indicates irregular spotting with hormonal injections.   See flowsheet for further details and programmatic requirements Hyperlink available at the top of the signed note in blue.  Flow sheet content below:  Pregnancy Intention Screening Does the patient want to become pregnant in the next year?: No Does the patient's partner want to become pregnant in the next year?: No Would the patient like to discuss contraceptive options today?: No Counseling Patient counseled to use  condoms with all sex: Condoms declined RTC in 2-3 weeks for test results: Yes Clinic will call if test results abnormal before test result appt.: Yes Test results given to patient Patient counseled to use condoms with all sex: Condoms declined   Screening for MPX risk: Does the patient have an unexplained rash? No Is the patient MSM? No Does the patient endorse multiple sex partners or anonymous sex partners? No Did the patient have close or sexual contact with a person diagnosed with MPX? No Has the patient traveled outside the [place]  where MPX is endemic? No Is there a high clinical suspicion for MPX-- evidenced by one of the following No  -Unlikely to be chickenpox  -Lymphadenopathy  -Rash that present in same phase of evolution on any given body part  Screenings: Last HIV test per patient/review of record was No results found for: "HMHIVSCREEN"  Lab Results  Component Value Date   HIV Non Reactive 09/25/2022     Last HEPC test per patient/review of record was No results found for: "HMHEPCSCREEN" No components found for: "HEPC"   Last HEPB test per patient/review of record was No components found for: "HMHEPBSCREEN"   Patient reports last pap was:   No results found for: "SPECADGYN" Result Date Procedure Results Follow-ups  09/25/2022 Cytology - PAP Neisseria Gonorrhea: Negative Chlamydia: Negative Trichomonas: Positive (A) Adequacy: Satisfactory for evaluation; transformation zone component PRESENT. Diagnosis: - Negative for intraepithelial lesion or malignancy (NILM) Comment: Normal Reference Range Trichomonas - Negative Comment: Normal Reference Ranger Chlamydia - Negative Comment: Normal Reference Range Neisseria Gonorrhea - Negative   06/10/2019 Cytology - PAP Chlamydia: Negative  Neisseria Gonorrhea: Negative Trichomonas: Negative Adequacy: Satisfactory for evaluation; transformation zone component PRESENT. Diagnosis: - Negative for intraepithelial lesion or malignancy  (NILM) Comment: Normal Reference Ranger Chlamydia - Negative Comment: Normal Reference Range Neisseria Gonorrhea - Negative Comment: Normal Reference Range Trichomonas - Negative     Immunization history:  Immunization History  Administered Date(s) Administered   HIB (PRP-OMP) 12/17/1997, 02/15/1998, 01/27/1999   Hepatitis A 04/01/2012   Hepatitis B 12/17/1997, 02/15/1998, 08/05/1998   Influenza Inj Mdck Quad Pf 01/17/2018, 01/13/2019   Influenza Nasal 11/13/2006, 11/29/2007, 12/01/2008   Influenza,inj,Quad PF,6+ Mos 01/02/2017   Meningococcal Conjugate 04/01/2012   Tdap 11/17/2016    The following portions of the patient's history were reviewed and updated as appropriate: allergies, current medications, past medical history, past social history, past surgical history and problem list.  Objective:  There were no vitals filed for this visit.  Physical Exam Nursing note reviewed. Exam conducted with a chaperone present Susanna Epley, CNA present as chaperone).  Constitutional:      Appearance: Normal appearance.  HENT:     Head: Normocephalic.     Salivary Glands: Right salivary gland is not diffusely enlarged or tender. Left salivary gland is not diffusely enlarged or tender.     Mouth/Throat:     Lips: Pink. No lesions.     Mouth: Mucous membranes are moist.     Tongue: No lesions. Tongue does not deviate from midline.     Pharynx: Oropharynx is clear. Uvula midline.     Tonsils: No tonsillar exudate.  Eyes:     General:        Right eye: No discharge.        Left eye: No discharge.  Pulmonary:     Effort: Pulmonary effort is normal.  Genitourinary:    General: Normal vulva.     Exam position: Lithotomy position.     Pubic Area: No rash or pubic lice.      Tanner stage (genital): 5.     Labia:        Right: No rash, tenderness, lesion or injury.        Left: No rash, tenderness, lesion or injury.      Vagina: Normal. No vaginal discharge, erythema, tenderness, bleeding  or lesions.     Cervix: Normal. No cervical motion tenderness, discharge, friability, lesion, erythema, cervical bleeding or eversion.     Uterus: Normal.      Adnexa: Right adnexa normal and left adnexa normal.     Comments: pH<4.5 No abnormal discharge and no malodor on exam.  Lymphadenopathy:     Head:     Right side of head: No submental, submandibular, tonsillar, preauricular or posterior auricular adenopathy.     Left side of head: No submental, submandibular, tonsillar, preauricular or posterior auricular adenopathy.     Cervical: No cervical adenopathy.     Right cervical: No superficial or posterior cervical adenopathy.    Left cervical: No superficial or posterior cervical adenopathy.     Upper Body:     Right upper body: No supraclavicular or axillary adenopathy.     Left upper body: No supraclavicular or axillary adenopathy.     Lower Body: No right inguinal adenopathy. No left inguinal adenopathy.  Skin:    General: Skin is warm and dry.     Findings: No rash.     Comments: Skin tone appropriate for ethnicity.   Neurological:     Mental Status: She is alert and oriented to person, place,  and time.  Psychiatric:        Attention and Perception: Attention and perception normal.        Mood and Affect: Mood and affect normal.        Speech: Speech normal.        Behavior: Behavior normal. Behavior is cooperative.        Thought Content: Thought content normal.      Assessment and Plan:  THU BAGGETT is a 28 y.o. female presenting to the [facility] Department for STI screening  1. Screening for venereal disease (Primary)  - Chlamydia/Gonorrhea [place] [facility] - Gonococcus culture - WET PREP FOR TRICH, YEAST, CLUE  Patient accepted the following screenings: oral GC culture, vaginal CT/GC swabs, and vaginal wet prep Patient meets criteria for HepB screening? Yes. Ordered? Patient declined Patient meets criteria for HepC screening? Yes. Ordered? Patient  declined  Treat wet prep per standing order Discussed time line for State Lab results and that patient will be called with positive results and encouraged patient to call if she had not heard in 2 weeks.  Counseled to return or seek care for continued or worsening symptoms Recommended repeat testing in 3 months with positive results. Recommended condom use with all sex for STI prevention.   Patient is currently using Hormonal Contraception: Injection, Rings and Patches to prevent pregnancy.    No follow-ups on file.  Future Appointments  Date Time Provider Department Center  09/07/2023  1:15 PM [facility]SE [facility] None   Total time with patient 30 minutes.   Merleen Stare, NP

## 2023-09-07 ENCOUNTER — Ambulatory Visit

## 2023-09-07 NOTE — Progress Notes (Deleted)
    NURSE VISIT NOTE  Subjective:    Patient ID: Savannah Carson, female    DOB: 1995/09/28, 28 y.o.   MRN: [id number]  HPI  Patient is a 28 y.o. G19P1001 female who presents for depo provera  injection.   Objective:    There were no vitals taken for this visit.  Last Annual: 09/25/2022. Last pap: 09/25/2022. Last Depo-Provera : 06/13/2023. Side Effects if any: none. Serum HCG indicated? No . Depo-Provera  150 mg IM given by: Camelia Fetters, CMA. Site: Right Upper Outer Quandrant  Lab Review  No results found for any visits on 09/07/23.  Assessment:   1. Encounter for surveillance of injectable contraceptive      Plan:   Next appointment due between Sept 12 and Sept. 17      Camelia Fetters, CMA [facility]

## 2023-09-07 NOTE — Patient Instructions (Incomplete)

## 2023-09-13 ENCOUNTER — Ambulatory Visit

## 2023-09-13 VITALS — BP 102/74 | HR 117 | Ht 69.0 in | Wt 161.0 lb

## 2023-09-13 DIAGNOSIS — Z3042 Encounter for surveillance of injectable contraceptive: Secondary | ICD-10-CM | POA: Diagnosis not present

## 2023-09-13 DIAGNOSIS — Z3202 Encounter for pregnancy test, result negative: Secondary | ICD-10-CM | POA: Diagnosis not present

## 2023-09-13 LAB — POCT URINE PREGNANCY: Preg Test, Ur: NEGATIVE

## 2023-09-13 MED ORDER — MEDROXYPROGESTERONE ACETATE 150 MG/ML IM SUSP
150.0000 mg | Freq: Once | INTRAMUSCULAR | Status: AC
  Administered 2023-09-13: 150 mg via INTRAMUSCULAR

## 2023-09-13 NOTE — Progress Notes (Signed)
    NURSE VISIT NOTE  Subjective:    Patient ID: Savannah Carson, female    DOB: [birth year], 28 y.o.   MRN: [id number]  HPI  Patient is a 28 y.o. G15P1001 female who presents for depo provera  injection.   Objective:    There were no vitals taken for this visit.  Last Annual: 09/25/22. Last pap: 09/25/22. Last Depo-Provera : 06/13/23. Side Effects if any: none. Serum HCG indicated? No . Depo-Provera  150 mg IM given by: Harlene Gander, CMA. Site: Right Deltoid  Lab Review  No results found for any visits on 09/13/23.  Assessment:   1. Encounter for surveillance of injectable contraceptive      Plan:   Next appointment due between 11/29/23 and 12/13/23.    Harlene Gander, CMA

## 2023-09-13 NOTE — Patient Instructions (Signed)
 Birth Control Shot: What to Expect A birth control shot prevents pregnancy. A birth control shot is put in (injected) into the skin or a muscle. The shot contains the hormone progestin. Hormones are chemicals that affect how the body works. Progestin prevents pregnancy because it: Stops the ovaries from releasing eggs. Makes cervical mucus thicker. This prevents sperm from getting into the cervix. The cervix is the lowest part of the uterus. Thins the lining of the uterus to prevent a fertilized egg from attaching to the uterus. Tell a health care provider about: Any allergies you have. All medicines you take. These include vitamins, herbs, eye drops, and creams. Any bleeding problems you have. Any medical problems you have. Whether you're pregnant or may be pregnant. What are the risks? Your health care provider will talk with you about risks. These may include: Mood changes or depression. Your bones becoming thinner or weaker. This is called loss of bone density. This can happen if you get the shots for a long period of time. This can cause your bones to break. Blood clots. These are rare. A higher risk of an egg being fertilized outside your uterus, called an ectopic pregnancy.This is rare. What happens before? Your provider may do a physical exam. You may have a test to make sure you aren't pregnant. What happens during a birth control shot?  The area where the shot will be given will be cleaned. A needle will be put into a muscle in your upper arm or butt, or into the skin of your thigh or belly. The needle will be put on a syringe with the medicine in it. The medicine will be pushed through the syringe into your body. A small bandage may be put over the place where the shot was given. What happens after? After the shot, it's common to have: Soreness around the place where the shot was given for a couple of days. Spotting or bleeding between periods. Weight gain. Tender  breasts. Headaches. Belly pain. Ask your provider if you need to use an added method of birth control, such as a condom, sponge, or spermicide. If the first shot is given 1-7 days after the start of your last period, you won't need to use an added method of birth control. If the first shot is given at any other time during your menstrual cycle, you'll need to use an added method of birth control for 7 days after you get the shot. Follow these instructions at home: General instructions Take your medicines only as told. Do not rub or massage the place where the shot was given. Track your periods. This will help you know if they become irregular. Always use a condom to protect against sexually transmitted infections (STIs). Make an appointment in time for your next shot and mark it on your calendar. You must get a shot every 3 months (12-13 weeks) to prevent pregnancy. Lifestyle Do not smoke, vape, or use nicotine or tobacco. Eat foods that are high in calcium and vitamin D, such as milk, cheese, and salmon. Calcium helps keep your bones strong and may help with any loss in bone density caused by the birth control shot. Ask your provider if you should take supplements. Contact a health care provider if you: Have discharge or bleeding from your vagina that isn't normal. Miss a period or think you might be pregnant. Have mood changes or depression. Feel dizzy or light-headed. Have leg pain. Get help right away if you: Have chest pain  or cough up blood. Have trouble breathing. Have a really bad headache that doesn't go away. Have numbness in any part of your body. Have really bad pain in your belly. Have slurred speech or vision problems. These symptoms may be an emergency. Call 911 right away. Do not wait to see if the symptoms will go away. Do not drive yourself to the hospital. This information is not intended to replace advice given to you by your health care provider. Make sure you  discuss any questions you have with your health care provider. Document Revised: 11/02/2022 Document Reviewed: 11/02/2022 Elsevier Patient Education  2024 [employer].

## 2023-09-25 NOTE — Progress Notes (Deleted)
 GYNECOLOGY ANNUAL PHYSICAL EXAM PROGRESS NOTE  Subjective:    Savannah Carson is a 28 y.o. G80P1001 female who presents for an annual exam.  The patient {is/is not/has never been:13135} sexually active. The patient participates in regular exercise: {yes/no/not asked:9010}. Has the patient ever been transfused or tattooed?: {yes/no/not asked:9010}. The patient reports that there {is/is not:9024} domestic violence in her life.   The patient has the following complaints today:   Menstrual History: Menarche age: *** No LMP recorded. Patient has had an injection.     Gynecologic History:  Contraception: Depo-Provera  injections History of STI's: Trichomonas Last Pap: 09/25/2022. Results were: normal. Denies h/o abnormal pap smears. Last mammogram: Not age appropriate (due at age 58)       OB History  Gravida Para Term Preterm AB Living  1 1 1  0 0 1  SAB IAB Ectopic Multiple Live Births  0 0 0 0 1    # Outcome Date GA Lbr Len/2nd Weight Sex Type Anes PTL Lv  1 Term 02/07/17 [redacted]w[redacted]d  7 lb 14.6 oz (3.59 kg) F Vag-Spont EPI  LIV     Name: GLESSIE, EUSTICE     Apgar1: 8  Apgar5: 9    Past Medical History:  Diagnosis Date   Anxiety    Asthma     No past surgical history on file.  No family history on file.  Social History   Socioeconomic History   Marital status: Single    Spouse name: Not on file   Number of children: Not on file   Years of education: Not on file   Highest education level: Not on file  Occupational History   Not on file  Tobacco Use   Smoking status: Never   Smokeless tobacco: Never  Vaping Use   Vaping status: Never Used  Substance and Sexual Activity   Alcohol use: Yes    Alcohol/week: 3.0 standard drinks of alcohol    Types: 1 Cans of beer, 2 Shots of liquor per week    Comment: q weekend   Drug use: Yes    Types: Marijuana   Sexual activity: Yes    Partners: Male    Birth control/protection: Injection  Other Topics Concern    Not on file  Social History Narrative   Not on file   Social Drivers of Health   Financial Resource Strain: Low Risk  (01/05/2023)   [facility]   Overall Financial Resource Strain (CARDIA)    Difficulty of Paying Living Expenses: Not hard at all  Food Insecurity: No Food Insecurity (01/05/2023)   Received from [facility]   Hunger Vital Sign    Within the past 12 months, you worried that your food would run out before you got the money to buy more.: Never true    Within the past 12 months, the food you bought just didn't last and you didn't have money to get more.: Never true  Transportation Needs: No Transportation Needs (01/05/2023)   [facility] - Transportation    In the past 12 months, has lack of transportation kept you from medical appointments or from getting medications?: No    Lack of Transportation (Non-Medical): No  Physical Activity: Not on file  Stress: Not on file  Social Connections: Not on file  Intimate Partner Violence: Not on file    Current Outpatient Medications on File Prior to Visit  Medication Sig Dispense  Refill   albuterol (PROVENTIL HFA;VENTOLIN HFA) 108 (90 Base) MCG/ACT inhaler Inhale 2 puffs into the lungs every 4 (four) hours as needed for wheezing or shortness of breath.      escitalopram (LEXAPRO) 10 MG tablet Take 10 mg by mouth daily.     hydrOXYzine  (ATARAX ) 10 MG tablet Take 1 tablet (10 mg total) by mouth 3 (three) times daily as needed. 30 tablet 0   medroxyPROGESTERone  Acetate 150 MG/ML SUSY Inject 1 mL (150 mg total) as directed every 3 (three) months. 3 mL 3   metroNIDAZOLE  (FLAGYL ) 500 MG tablet Take 1 tablet (500 mg total) by mouth 2 (two) times daily. (Patient not taking: Reported on 09/13/2023) 14 tablet 0   metroNIDAZOLE  (METROGEL ) 0.75 % vaginal gel Place 1 Applicatorful vaginally at bedtime. Apply one applicatorful to vagina at bedtime for 5  days (Patient not taking: Reported on 09/13/2023) 70 g 1   norethindrone  (ORTHO MICRONOR ) 0.35 MG tablet Take 1 tablet (0.35 mg total) by mouth daily. (Patient not taking: Reported on 09/13/2023) 84 tablet 3   ondansetron  (ZOFRAN ) 4 MG tablet Take 2 tablets (8 mg total) by mouth every 8 (eight) hours as needed for nausea or vomiting. (Patient not taking: Reported on 09/13/2023) 3 tablet 0   No current facility-administered medications on file prior to visit.    Allergies  Allergen Reactions   Augmentin [Amoxicillin-Pot Clavulanate] Hives   Cefprozil Other (See Comments)   Cephalosporins Hives   Penicillins Rash   Sulfamethoxazole -Trimethoprim  Rash     Review of Systems Constitutional: negative for chills, fatigue, fevers and sweats Eyes: negative for irritation, redness and visual disturbance Ears, nose, mouth, throat, and face: negative for hearing loss, nasal congestion, snoring and tinnitus Respiratory: negative for asthma, cough, sputum Cardiovascular: negative for chest pain, dyspnea, exertional chest pressure/discomfort, irregular heart beat, palpitations and syncope Gastrointestinal: negative for abdominal pain, change in bowel habits, nausea and vomiting Genitourinary: negative for abnormal menstrual periods, genital lesions, sexual problems and vaginal discharge, dysuria and urinary incontinence Integument/breast: negative for breast lump, breast tenderness and nipple discharge Hematologic/lymphatic: negative for bleeding and easy bruising Musculoskeletal:negative for back pain and muscle weakness Neurological: negative for dizziness, headaches, vertigo and weakness Endocrine: negative for diabetic symptoms including polydipsia, polyuria and skin dryness Allergic/Immunologic: negative for hay fever and urticaria      Objective:  There were no vitals taken for this visit. There is no height or weight on file to calculate BMI.    General Appearance:    Alert, cooperative, no  distress, appears stated age  Head:    Normocephalic, without obvious abnormality, atraumatic  Eyes:    PERRL, conjunctiva/corneas clear, EOM's intact, both eyes  Ears:    Normal external ear canals, both ears  Nose:   Nares normal, septum midline, mucosa normal, no drainage or sinus tenderness  Throat:   Lips, mucosa, and tongue normal; teeth and gums normal  Neck:   Supple, symmetrical, trachea midline, no adenopathy; thyroid : no enlargement/tenderness/nodules; no carotid bruit or JVD  Back:     Symmetric, no curvature, ROM normal, no CVA tenderness  Lungs:     Clear to auscultation bilaterally, respirations unlabored  Chest Wall:    No tenderness or deformity   Heart:    Regular rate and rhythm, S1 and S2 normal, no murmur, rub or gallop  Breast Exam:    No tenderness, masses, or nipple abnormality  Abdomen:     Soft, non-tender, bowel sounds active all four quadrants, no masses,  no organomegaly.    Genitalia:    Pelvic:external genitalia normal, vagina without lesions, discharge, or tenderness, rectovaginal septum  normal. Cervix normal in appearance, no cervical motion tenderness, no adnexal masses or tenderness.  Uterus normal size, shape, mobile, regular contours, nontender.  Rectal:    Normal external sphincter.  No hemorrhoids appreciated. Internal exam not done.   Extremities:   Extremities normal, atraumatic, no cyanosis or edema  Pulses:   2+ and symmetric all extremities  Skin:   Skin color, texture, turgor normal, no rashes or lesions  Lymph nodes:   Cervical, supraclavicular, and axillary nodes normal  Neurologic:   CNII-XII intact, normal strength, sensation and reflexes throughout   .  Labs:  Lab Results  Component Value Date   WBC 4.2 02/26/2023   HGB 13.8 02/26/2023   HCT 40.2 02/26/2023   MCV 85.7 02/26/2023   PLT 301 02/26/2023    Lab Results  Component Value Date   CREATININE 0.89 02/26/2023   BUN 15 02/26/2023   NA 135 02/26/2023   K 3.4 (L) 02/26/2023    CL 105 02/26/2023   CO2 23 02/26/2023    Lab Results  Component Value Date   ALT 14 02/26/2023   AST 19 02/26/2023   ALKPHOS 53 02/26/2023   BILITOT 0.7 02/26/2023    Lab Results  Component Value Date   TSH 1.180 06/23/2020     Assessment:   No diagnosis found.   Plan:  Blood tests: {blood tests:13147}. Breast self exam technique reviewed and patient encouraged to perform self-exam monthly. Contraception: {contraceptive methods:5051}. Discussed healthy lifestyle modifications. Mammogram {discussed/ordered:14545} Pap smear {discussed/ordered:14545}. Flu vaccine: Follow up in 1 year for annual exam   Kizzie Camelia CROME, CMA [facility]

## 2023-09-25 NOTE — Patient Instructions (Incomplete)
 Preventive Care 55-26 Years Old, Female Preventive care refers to lifestyle choices and visits with your health care provider that can promote health and wellness. Preventive care visits are also called wellness exams. What can I expect for my preventive care visit? Counseling During your preventive care visit, your health care provider may ask about your: Medical history, including: Past medical problems. Family medical history. Pregnancy history. Current health, including: Menstrual cycle. Method of birth control. Emotional well-being. Home life and relationship well-being. Sexual activity and sexual health. Lifestyle, including: Alcohol, nicotine or tobacco, and drug use. Access to firearms. Diet, exercise, and sleep habits. Work and work Astronomer. Sunscreen use. Safety issues such as seatbelt and bike helmet use. Physical exam Your health care provider may check your: Height and weight. These may be used to calculate your BMI (body mass index). BMI is a measurement that tells if you are at a healthy weight. Waist circumference. This measures the distance around your waistline. This measurement also tells if you are at a healthy weight and may help predict your risk of certain diseases, such as type 2 diabetes and high blood pressure. Heart rate and blood pressure. Body temperature. Skin for abnormal spots. What immunizations do I need?  Vaccines are usually given at various ages, according to a schedule. Your health care provider will recommend vaccines for you based on your age, medical history, and lifestyle or other factors, such as travel or where you work. What tests do I need? Screening Your health care provider may recommend screening tests for certain conditions. This may include: Pelvic exam and Pap test. Lipid and cholesterol levels. Diabetes screening. This is done by checking your blood sugar (glucose) after you have not eaten for a while (fasting). Hepatitis  B test. Hepatitis C test. HIV (human immunodeficiency virus) test. STI (sexually transmitted infection) testing, if you are at risk. BRCA-related cancer screening. This may be done if you have a family history of breast, ovarian, tubal, or peritoneal cancers. Talk with your health care provider about your test results, treatment options, and if necessary, the need for more tests. Follow these instructions at home: Eating and drinking  Eat a healthy diet that includes fresh fruits and vegetables, whole grains, lean protein, and low-fat dairy products. Take vitamin and mineral supplements as recommended by your health care provider. Do not drink alcohol if: Your health care provider tells you not to drink. You are pregnant, may be pregnant, or are planning to become pregnant. If you drink alcohol: Limit how much you have to 0-1 drink a day. Know how much alcohol is in your drink. In the [place] one drink equals one 12 oz bottle of beer (355 mL), one 5 oz glass of wine (148 mL), or one 1 oz glass of hard liquor (44 mL). Lifestyle Brush your teeth every morning and night with fluoride toothpaste. Floss one time each day. Exercise for at least 30 minutes 5 or more days each week. Do not use any products that contain nicotine or tobacco. These products include cigarettes, chewing tobacco, and vaping devices, such as e-cigarettes. If you need help quitting, ask your health care provider. Do not use drugs. If you are sexually active, practice safe sex. Use a condom or other form of protection to prevent STIs. If you do not wish to become pregnant, use a form of birth control. If you plan to become pregnant, see your health care provider for a prepregnancy visit. Find healthy ways to manage stress, such as: Meditation,  yoga, or listening to music. Journaling. Talking to a trusted person. Spending time with friends and family. Minimize exposure to UV radiation to reduce your risk of skin  cancer. Safety Always wear your seat belt while driving or riding in a vehicle. Do not drive: If you have been drinking alcohol. Do not ride with someone who has been drinking. If you have been using any mind-altering substances or drugs. While texting. When you are tired or distracted. Wear a helmet and other protective equipment during sports activities. If you have firearms in your house, make sure you follow all gun safety procedures. Seek help if you have been physically or sexually abused. What's next? Go to your health care provider once a year for an annual wellness visit. Ask your health care provider how often you should have your eyes and teeth checked. Stay up to date on all vaccines. This information is not intended to replace advice given to you by your health care provider. Make sure you discuss any questions you have with your health care provider. Document Revised: 08/25/2020 Document Reviewed: 08/25/2020 Elsevier Patient Education  2024 [employer] Breast Self-Awareness Breast self-awareness is knowing how your breasts look and feel. You need to: Check your breasts on a regular basis. Tell your doctor about any changes. Become familiar with the look and feel of your breasts. This can help you catch a breast problem while it is still small and can be treated. You should do breast self-exams even if you have breast implants. What you need: A mirror. A well-lit room. A pillow or other soft object. How to do a breast self-exam Follow these steps to do a breast self-exam: Look for changes  Take off all the clothes above your waist. Stand in front of a mirror in a room with good lighting. Put your hands down at your sides. Compare your breasts in the mirror. Look for any difference between them, such as: A difference in shape. A difference in size. Wrinkles, dips, and bumps in one breast and not the other. Look at each breast for changes in the skin, such  as: Redness. Scaly areas. Skin that has gotten thicker. Dimpling. Open sores (ulcers). Look for changes in your nipples, such as: Fluid coming out of a nipple. Fluid around a nipple. Bleeding. Dimpling. Redness. A nipple that looks pushed in (retracted), or that has changed position. Feel for changes Lie on your back. Feel each breast. To do this: Pick a breast to feel. Place a pillow under the shoulder closest to that breast. Put the arm closest to that breast behind your head. Feel the nipple area of that breast using the hand of your other arm. Feel the area with the pads of your three middle fingers by making small circles with your fingers. Use light, medium, and firm pressure. Continue the overlapping circles, moving downward over the breast. Keep making circles with your fingers. Stop when you feel your ribs. Start making circles with your fingers again, this time going upward until you reach your collarbone. Then, make circles outward across your breast and into your armpit area. Squeeze your nipple. Check for discharge and lumps. Repeat these steps to check your other breast. Sit or stand in the tub or shower. With soapy water on your skin, feel each breast the same way you did when you were lying down. Write down what you find Writing down what you find can help you remember what to tell your doctor. Write down: What is  normal for each breast. Any changes you find in each breast. These include: The kind of changes you find. A tender or painful breast. Any lump you find. Write down its size and where it is. When you last had your monthly period (menstrual cycle). General tips If you are breastfeeding, the best time to check your breasts is after you feed your baby or after you use a breast pump. If you get monthly bleeding, the best time to check your breasts is 5-7 days after your monthly cycle ends. With time, you will become comfortable with the self-exam. You will  also start to know if there are changes in your breasts. Contact a doctor if: You see a change in the shape or size of your breasts or nipples. You see a change in the skin of your breast or nipples, such as red or scaly skin. You have fluid coming from your nipples that is not normal. You find a new lump or thick area. You have breast pain. You have any concerns about your breast health. Summary Breast self-awareness includes looking for changes in your breasts and feeling for changes within your breasts. You should do breast self-awareness in front of a mirror in a well-lit room. If you get monthly periods (menstrual cycles), the best time to check your breasts is 5-7 days after your period ends. Tell your doctor about any changes you see in your breasts. Changes include changes in size, changes on the skin, painful or tender breasts, or fluid from your nipples that is not normal. This information is not intended to replace advice given to you by your health care provider. Make sure you discuss any questions you have with your health care provider. Document Revised: 08/04/2021 Document Reviewed: 12/30/2020 Elsevier Patient Education  2024 [employer].

## 2023-09-26 ENCOUNTER — Ambulatory Visit: Admitting: Certified Nurse Midwife

## 2023-09-26 DIAGNOSIS — Z113 Encounter for screening for infections with a predominantly sexual mode of transmission: Secondary | ICD-10-CM

## 2023-09-26 DIAGNOSIS — Z01419 Encounter for gynecological examination (general) (routine) without abnormal findings: Secondary | ICD-10-CM

## 2023-10-10 NOTE — Progress Notes (Deleted)
 GYNECOLOGY ANNUAL PHYSICAL EXAM PROGRESS NOTE  Subjective:    Savannah Carson is a 28 y.o. G54P1001 female who presents for an annual exam.  The patient {is/is not/has never been:13135} sexually active. The patient participates in regular exercise: {yes/no/not asked:9010}. Has the patient ever been transfused or tattooed?: {yes/no/not asked:9010}. The patient reports that there {is/is not:9024} domestic violence in her life.   The patient has the following complaints today:   Menstrual History: Menarche age: *** No LMP recorded. Patient has had an injection.     Gynecologic History:  Contraception: Depo-Provera  injections History of STI's: Trichomonas Last Pap: 09/25/2022. Results were: normal. Denies h/o abnormal pap smears. Last mammogram: Not age appropriate (due at age 24)    OB History  Gravida Para Term Preterm AB Living  1 1 1  0 0 1  SAB IAB Ectopic Multiple Live Births  0 0 0 0 1    # Outcome Date GA Lbr Len/2nd Weight Sex Type Anes PTL Lv  1 Term 02/07/17 [redacted]w[redacted]d  7 lb 14.6 oz (3.59 kg) F Vag-Spont EPI  LIV     Name: Savannah Carson     Apgar1: 8  Apgar5: 9    Past Medical History:  Diagnosis Date   Anxiety    Asthma     No past surgical history on file.  No family history on file.  Social History   Socioeconomic History   Marital status: Single    Spouse name: Not on file   Number of children: Not on file   Years of education: Not on file   Highest education level: Not on file  Occupational History   Not on file  Tobacco Use   Smoking status: Never   Smokeless tobacco: Never  Vaping Use   Vaping status: Never Used  Substance and Sexual Activity   Alcohol use: Yes    Alcohol/week: 3.0 standard drinks of alcohol    Types: 1 Cans of beer, 2 Shots of liquor per week    Comment: q weekend   Drug use: Yes    Types: Marijuana   Sexual activity: Yes    Partners: Male    Birth control/protection: Injection  Other Topics Concern   Not on  file  Social History Narrative   Not on file   Social Drivers of Health   Financial Resource Strain: Low Risk  (01/05/2023)   Received from [facility]   Overall Financial Resource Strain (CARDIA)    Difficulty of Paying Living Expenses: Not hard at all  Food Insecurity: No Food Insecurity (01/05/2023)   Received from [facility]   Hunger Vital Sign    Within the past 12 months, you worried that your food would run out before you got the money to buy more.: Never true    Within the past 12 months, the food you bought just didn't last and you didn't have money to get more.: Never true  Transportation Needs: No Transportation Needs (01/05/2023)   Received from [facility] - Transportation    In the past 12 months, has lack of transportation kept you from medical appointments or from getting medications?: No    Lack of Transportation (Non-Medical): No  Physical Activity: Not on file  Stress: Not on file  Social Connections: Not on file  Intimate Partner Violence: Not on file    Current Outpatient Medications on File Prior to Visit  Medication Sig Dispense Refill  albuterol (PROVENTIL HFA;VENTOLIN HFA) 108 (90 Base) MCG/ACT inhaler Inhale 2 puffs into the lungs every 4 (four) hours as needed for wheezing or shortness of breath.      escitalopram (LEXAPRO) 10 MG tablet Take 10 mg by mouth daily.     hydrOXYzine  (ATARAX ) 10 MG tablet Take 1 tablet (10 mg total) by mouth 3 (three) times daily as needed. 30 tablet 0   medroxyPROGESTERone  Acetate 150 MG/ML SUSY Inject 1 mL (150 mg total) as directed every 3 (three) months. 3 mL 3   metroNIDAZOLE  (FLAGYL ) 500 MG tablet Take 1 tablet (500 mg total) by mouth 2 (two) times daily. (Patient not taking: Reported on 09/13/2023) 14 tablet 0   metroNIDAZOLE  (METROGEL ) 0.75 % vaginal gel Place 1 Applicatorful vaginally at bedtime. Apply one applicatorful to vagina at bedtime for 5 days  (Patient not taking: Reported on 09/13/2023) 70 g 1   norethindrone  (ORTHO MICRONOR ) 0.35 MG tablet Take 1 tablet (0.35 mg total) by mouth daily. (Patient not taking: Reported on 09/13/2023) 84 tablet 3   ondansetron  (ZOFRAN ) 4 MG tablet Take 2 tablets (8 mg total) by mouth every 8 (eight) hours as needed for nausea or vomiting. (Patient not taking: Reported on 09/13/2023) 3 tablet 0   No current facility-administered medications on file prior to visit.    Allergies  Allergen Reactions   Augmentin [Amoxicillin-Pot Clavulanate] Hives   Cefprozil Other (See Comments)   Cephalosporins Hives   Penicillins Rash   Sulfamethoxazole -Trimethoprim  Rash     Review of Systems Constitutional: negative for chills, fatigue, fevers and sweats Eyes: negative for irritation, redness and visual disturbance Ears, nose, mouth, throat, and face: negative for hearing loss, nasal congestion, snoring and tinnitus Respiratory: negative for asthma, cough, sputum Cardiovascular: negative for chest pain, dyspnea, exertional chest pressure/discomfort, irregular heart beat, palpitations and syncope Gastrointestinal: negative for abdominal pain, change in bowel habits, nausea and vomiting Genitourinary: negative for abnormal menstrual periods, genital lesions, sexual problems and vaginal discharge, dysuria and urinary incontinence Integument/breast: negative for breast lump, breast tenderness and nipple discharge Hematologic/lymphatic: negative for bleeding and easy bruising Musculoskeletal:negative for back pain and muscle weakness Neurological: negative for dizziness, headaches, vertigo and weakness Endocrine: negative for diabetic symptoms including polydipsia, polyuria and skin dryness Allergic/Immunologic: negative for hay fever and urticaria      Objective:  There were no vitals taken for this visit. There is no height or weight on file to calculate BMI.    General Appearance:    Alert, cooperative, no  distress, appears stated age  Head:    Normocephalic, without obvious abnormality, atraumatic  Eyes:    PERRL, conjunctiva/corneas clear, EOM's intact, both eyes  Ears:    Normal external ear canals, both ears  Nose:   Nares normal, septum midline, mucosa normal, no drainage or sinus tenderness  Throat:   Lips, mucosa, and tongue normal; teeth and gums normal  Neck:   Supple, symmetrical, trachea midline, no adenopathy; thyroid : no enlargement/tenderness/nodules; no carotid bruit or JVD  Back:     Symmetric, no curvature, ROM normal, no CVA tenderness  Lungs:     Clear to auscultation bilaterally, respirations unlabored  Chest Wall:    No tenderness or deformity   Heart:    Regular rate and rhythm, S1 and S2 normal, no murmur, rub or gallop  Breast Exam:    No tenderness, masses, or nipple abnormality  Abdomen:     Soft, non-tender, bowel sounds active all four quadrants, no masses, no organomegaly.  Genitalia:    Pelvic:external genitalia normal, vagina without lesions, discharge, or tenderness, rectovaginal septum  normal. Cervix normal in appearance, no cervical motion tenderness, no adnexal masses or tenderness.  Uterus normal size, shape, mobile, regular contours, nontender.  Rectal:    Normal external sphincter.  No hemorrhoids appreciated. Internal exam not done.   Extremities:   Extremities normal, atraumatic, no cyanosis or edema  Pulses:   2+ and symmetric all extremities  Skin:   Skin color, texture, turgor normal, no rashes or lesions  Lymph nodes:   Cervical, supraclavicular, and axillary nodes normal  Neurologic:   CNII-XII intact, normal strength, sensation and reflexes throughout   .  Labs:  Lab Results  Component Value Date   WBC 4.2 02/26/2023   HGB 13.8 02/26/2023   HCT 40.2 02/26/2023   MCV 85.7 02/26/2023   PLT 301 02/26/2023    Lab Results  Component Value Date   CREATININE 0.89 02/26/2023   BUN 15 02/26/2023   NA 135 02/26/2023   K 3.4 (L) 02/26/2023    CL 105 02/26/2023   CO2 23 02/26/2023    Lab Results  Component Value Date   ALT 14 02/26/2023   AST 19 02/26/2023   ALKPHOS 53 02/26/2023   BILITOT 0.7 02/26/2023    Lab Results  Component Value Date   TSH 1.180 06/23/2020     Assessment:   No diagnosis found.   Plan:  Blood tests: {blood tests:13147}. Breast self exam technique reviewed and patient encouraged to perform self-exam monthly. Contraception: Depo-Provera  injections. Discussed healthy lifestyle modifications. Mammogram : not age appropriate Pap smear UTD. Flu vaccine: N/A Follow up in 1 year for annual exam     Damien Parsley, CNM [facility]

## 2023-10-10 NOTE — Patient Instructions (Incomplete)
 Preventive Care 55-26 Years Old, Female Preventive care refers to lifestyle choices and visits with your health care provider that can promote health and wellness. Preventive care visits are also called wellness exams. What can I expect for my preventive care visit? Counseling During your preventive care visit, your health care provider may ask about your: Medical history, including: Past medical problems. Family medical history. Pregnancy history. Current health, including: Menstrual cycle. Method of birth control. Emotional well-being. Home life and relationship well-being. Sexual activity and sexual health. Lifestyle, including: Alcohol, nicotine or tobacco, and drug use. Access to firearms. Diet, exercise, and sleep habits. Work and work Astronomer. Sunscreen use. Safety issues such as seatbelt and bike helmet use. Physical exam Your health care provider may check your: Height and weight. These may be used to calculate your BMI (body mass index). BMI is a measurement that tells if you are at a healthy weight. Waist circumference. This measures the distance around your waistline. This measurement also tells if you are at a healthy weight and may help predict your risk of certain diseases, such as type 2 diabetes and high blood pressure. Heart rate and blood pressure. Body temperature. Skin for abnormal spots. What immunizations do I need?  Vaccines are usually given at various ages, according to a schedule. Your health care provider will recommend vaccines for you based on your age, medical history, and lifestyle or other factors, such as travel or where you work. What tests do I need? Screening Your health care provider may recommend screening tests for certain conditions. This may include: Pelvic exam and Pap test. Lipid and cholesterol levels. Diabetes screening. This is done by checking your blood sugar (glucose) after you have not eaten for a while (fasting). Hepatitis  B test. Hepatitis C test. HIV (human immunodeficiency virus) test. STI (sexually transmitted infection) testing, if you are at risk. BRCA-related cancer screening. This may be done if you have a family history of breast, ovarian, tubal, or peritoneal cancers. Talk with your health care provider about your test results, treatment options, and if necessary, the need for more tests. Follow these instructions at home: Eating and drinking  Eat a healthy diet that includes fresh fruits and vegetables, whole grains, lean protein, and low-fat dairy products. Take vitamin and mineral supplements as recommended by your health care provider. Do not drink alcohol if: Your health care provider tells you not to drink. You are pregnant, may be pregnant, or are planning to become pregnant. If you drink alcohol: Limit how much you have to 0-1 drink a day. Know how much alcohol is in your drink. In the [place] one drink equals one 12 oz bottle of beer (355 mL), one 5 oz glass of wine (148 mL), or one 1 oz glass of hard liquor (44 mL). Lifestyle Brush your teeth every morning and night with fluoride toothpaste. Floss one time each day. Exercise for at least 30 minutes 5 or more days each week. Do not use any products that contain nicotine or tobacco. These products include cigarettes, chewing tobacco, and vaping devices, such as e-cigarettes. If you need help quitting, ask your health care provider. Do not use drugs. If you are sexually active, practice safe sex. Use a condom or other form of protection to prevent STIs. If you do not wish to become pregnant, use a form of birth control. If you plan to become pregnant, see your health care provider for a prepregnancy visit. Find healthy ways to manage stress, such as: Meditation,  yoga, or listening to music. Journaling. Talking to a trusted person. Spending time with friends and family. Minimize exposure to UV radiation to reduce your risk of skin  cancer. Safety Always wear your seat belt while driving or riding in a vehicle. Do not drive: If you have been drinking alcohol. Do not ride with someone who has been drinking. If you have been using any mind-altering substances or drugs. While texting. When you are tired or distracted. Wear a helmet and other protective equipment during sports activities. If you have firearms in your house, make sure you follow all gun safety procedures. Seek help if you have been physically or sexually abused. What's next? Go to your health care provider once a year for an annual wellness visit. Ask your health care provider how often you should have your eyes and teeth checked. Stay up to date on all vaccines. This information is not intended to replace advice given to you by your health care provider. Make sure you discuss any questions you have with your health care provider. Document Revised: 08/25/2020 Document Reviewed: 08/25/2020 Elsevier Patient Education  2024 [employer] Breast Self-Awareness Breast self-awareness is knowing how your breasts look and feel. You need to: Check your breasts on a regular basis. Tell your doctor about any changes. Become familiar with the look and feel of your breasts. This can help you catch a breast problem while it is still small and can be treated. You should do breast self-exams even if you have breast implants. What you need: A mirror. A well-lit room. A pillow or other soft object. How to do a breast self-exam Follow these steps to do a breast self-exam: Look for changes  Take off all the clothes above your waist. Stand in front of a mirror in a room with good lighting. Put your hands down at your sides. Compare your breasts in the mirror. Look for any difference between them, such as: A difference in shape. A difference in size. Wrinkles, dips, and bumps in one breast and not the other. Look at each breast for changes in the skin, such  as: Redness. Scaly areas. Skin that has gotten thicker. Dimpling. Open sores (ulcers). Look for changes in your nipples, such as: Fluid coming out of a nipple. Fluid around a nipple. Bleeding. Dimpling. Redness. A nipple that looks pushed in (retracted), or that has changed position. Feel for changes Lie on your back. Feel each breast. To do this: Pick a breast to feel. Place a pillow under the shoulder closest to that breast. Put the arm closest to that breast behind your head. Feel the nipple area of that breast using the hand of your other arm. Feel the area with the pads of your three middle fingers by making small circles with your fingers. Use light, medium, and firm pressure. Continue the overlapping circles, moving downward over the breast. Keep making circles with your fingers. Stop when you feel your ribs. Start making circles with your fingers again, this time going upward until you reach your collarbone. Then, make circles outward across your breast and into your armpit area. Squeeze your nipple. Check for discharge and lumps. Repeat these steps to check your other breast. Sit or stand in the tub or shower. With soapy water on your skin, feel each breast the same way you did when you were lying down. Write down what you find Writing down what you find can help you remember what to tell your doctor. Write down: What is  normal for each breast. Any changes you find in each breast. These include: The kind of changes you find. A tender or painful breast. Any lump you find. Write down its size and where it is. When you last had your monthly period (menstrual cycle). General tips If you are breastfeeding, the best time to check your breasts is after you feed your baby or after you use a breast pump. If you get monthly bleeding, the best time to check your breasts is 5-7 days after your monthly cycle ends. With time, you will become comfortable with the self-exam. You will  also start to know if there are changes in your breasts. Contact a doctor if: You see a change in the shape or size of your breasts or nipples. You see a change in the skin of your breast or nipples, such as red or scaly skin. You have fluid coming from your nipples that is not normal. You find a new lump or thick area. You have breast pain. You have any concerns about your breast health. Summary Breast self-awareness includes looking for changes in your breasts and feeling for changes within your breasts. You should do breast self-awareness in front of a mirror in a well-lit room. If you get monthly periods (menstrual cycles), the best time to check your breasts is 5-7 days after your period ends. Tell your doctor about any changes you see in your breasts. Changes include changes in size, changes on the skin, painful or tender breasts, or fluid from your nipples that is not normal. This information is not intended to replace advice given to you by your health care provider. Make sure you discuss any questions you have with your health care provider. Document Revised: 08/04/2021 Document Reviewed: 12/30/2020 Elsevier Patient Education  2024 [employer].

## 2023-10-12 ENCOUNTER — Encounter: Payer: Self-pay | Admitting: Certified Nurse Midwife

## 2023-10-12 ENCOUNTER — Ambulatory Visit (INDEPENDENT_AMBULATORY_CARE_PROVIDER_SITE_OTHER): Admitting: Certified Nurse Midwife

## 2023-10-12 ENCOUNTER — Ambulatory Visit: Admitting: Certified Nurse Midwife

## 2023-10-12 DIAGNOSIS — Z01419 Encounter for gynecological examination (general) (routine) without abnormal findings: Secondary | ICD-10-CM

## 2023-10-12 MED ORDER — NORELGESTROMIN-ETH ESTRADIOL 150-35 MCG/24HR TD PTWK: 1.0000 | MEDICATED_PATCH | TRANSDERMAL | 4 refills | Status: DC

## 2023-10-12 NOTE — Progress Notes (Signed)
 GYNECOLOGY ANNUAL PHYSICAL EXAM PROGRESS NOTE  Subjective:    Savannah Carson is a 28 y.o. G30P1001 female who presents for an annual exam.  The patient is sexually active. The patient participates in regular exercise: no. Has the patient ever been transfused or tattooed?: yes. The patient reports that there is not domestic violence in her life.   The patient has the following complaints today: She would like to stop taking the Depo Injection and try something else. She has been on Depo for 7 years.  Menstrual History: Menarche age: 52 No LMP recorded. Patient has had an injection. Period Duration (Days): 14-21 Period Pattern: (!) Irregular Menstrual Flow: Moderate Menstrual Control: Maxi pad Menstrual Control Change Freq (Hours): 1-2 Dysmenorrhea: None   Gynecologic History:  Contraception: Depo-Provera  injections History of STI's: Trichomonas Last Pap: 09/25/2022. Results were: normal. Denies h/o abnormal pap smears. Last mammogram: Not age appropriate (due at age 77)    OB History  Gravida Para Term Preterm AB Living  1 1 1  0 0 1  SAB IAB Ectopic Multiple Live Births  0 0 0 0 1    # Outcome Date GA Lbr Len/2nd Weight Sex Type Anes PTL Lv  1 Term 02/07/17 [redacted]w[redacted]d  7 lb 14.6 oz (3.59 kg) F Vag-Spont EPI  LIV     Name: Savannah Carson, Savannah Carson     Apgar1: 8  Apgar5: 9    Past Medical History:  Diagnosis Date   Anxiety    Asthma     History reviewed. No pertinent surgical history.  History reviewed. No pertinent family history.  Social History   Socioeconomic History   Marital status: Single    Spouse name: Not on file   Number of children: Not on file   Years of education: Not on file   Highest education level: Not on file  Occupational History   Not on file  Tobacco Use   Smoking status: Never   Smokeless tobacco: Never  Vaping Use   Vaping status: Never Used  Substance and Sexual Activity   Alcohol use: Yes    Alcohol/week: 3.0 standard drinks of  alcohol    Types: 1 Cans of beer, 2 Shots of liquor per week    Comment: q weekend   Drug use: Yes    Types: Marijuana   Sexual activity: Yes    Partners: Male    Birth control/protection: Injection  Other Topics Concern   Not on file  Social History Narrative   Not on file   Social Drivers of Health   Financial Resource Strain: Low Risk  (01/05/2023)   Received from [facility]   Overall Financial Resource Strain (CARDIA)    Difficulty of Paying Living Expenses: Not hard at all  Food Insecurity: No Food Insecurity (01/05/2023)   Received from [facility]   Hunger Vital Sign    Within the past 12 months, you worried that your food would run out before you got the money to buy more.: Never true    Within the past 12 months, the food you bought just didn't last and you didn't have money to get more.: Never true  Transportation Needs: No Transportation Needs (01/05/2023)   Received from [facility] - Transportation    In the past 12 months, has lack of transportation kept you from medical appointments or from getting medications?: No    Lack of Transportation (Non-Medical): No  Physical Activity: Not on  file  Stress: Not on file  Social Connections: Not on file  Intimate Partner Violence: Not on file    Current Outpatient Medications on File Prior to Visit  Medication Sig Dispense Refill   albuterol (PROVENTIL HFA;VENTOLIN HFA) 108 (90 Base) MCG/ACT inhaler Inhale 2 puffs into the lungs every 4 (four) hours as needed for wheezing or shortness of breath.      escitalopram (LEXAPRO) 10 MG tablet Take 10 mg by mouth daily.     hydrOXYzine  (ATARAX ) 10 MG tablet Take 1 tablet (10 mg total) by mouth 3 (three) times daily as needed. 30 tablet 0   metroNIDAZOLE  (FLAGYL ) 500 MG tablet Take 1 tablet (500 mg total) by mouth 2 (two) times daily. (Patient not taking: Reported on 09/13/2023) 14 tablet 0   metroNIDAZOLE  (METROGEL )  0.75 % vaginal gel Place 1 Applicatorful vaginally at bedtime. Apply one applicatorful to vagina at bedtime for 5 days (Patient not taking: Reported on 09/13/2023) 70 g 1   ondansetron  (ZOFRAN ) 4 MG tablet Take 2 tablets (8 mg total) by mouth every 8 (eight) hours as needed for nausea or vomiting. (Patient not taking: Reported on 09/13/2023) 3 tablet 0   No current facility-administered medications on file prior to visit.    Allergies  Allergen Reactions   Augmentin [Amoxicillin-Pot Clavulanate] Hives   Cefprozil Other (See Comments)   Cephalosporins Hives   Penicillins Rash   Sulfamethoxazole -Trimethoprim  Rash     Review of Systems Constitutional: negative for chills, fatigue, fevers and sweats Eyes: negative for irritation, redness and visual disturbance Ears, nose, mouth, throat, and face: negative for hearing loss, nasal congestion, snoring and tinnitus Respiratory: negative for asthma, cough, sputum Cardiovascular: negative for chest pain, dyspnea, exertional chest pressure/discomfort, irregular heart beat, palpitations and syncope Gastrointestinal: negative for abdominal pain, change in bowel habits, nausea and vomiting Genitourinary: negative for abnormal menstrual periods, genital lesions, sexual problems and vaginal discharge, dysuria and urinary incontinence Integument/breast: negative for breast lump, breast tenderness and nipple discharge Hematologic/lymphatic: negative for bleeding and easy bruising Musculoskeletal:negative for back pain and muscle weakness Neurological: negative for dizziness, headaches, vertigo and weakness Endocrine: negative for diabetic symptoms including polydipsia, polyuria and skin dryness Allergic/Immunologic: negative for hay fever and urticaria      Objective:  There were no vitals taken for this visit. There is no height or weight on file to calculate BMI.    General Appearance:    Alert, cooperative, no distress, appears stated age  Head:     Normocephalic, without obvious abnormality, atraumatic  Eyes:    PERRL, conjunctiva/corneas clear, EOM's intact, both eyes  Ears:    Normal external ear canals, both ears  Nose:   Nares normal, septum midline, mucosa normal, no drainage or sinus tenderness  Throat:   Lips, mucosa, and tongue normal; teeth and gums normal  Neck:   Supple, symmetrical, trachea midline, no adenopathy; thyroid : no enlargement/tenderness/nodules; no carotid bruit or JVD  Back:     Symmetric, no curvature, ROM normal, no CVA tenderness  Lungs:     Clear to auscultation bilaterally, respirations unlabored  Chest Wall:    No tenderness or deformity   Heart:    Regular rate and rhythm, S1 and S2 normal, no murmur, rub or gallop  Breast Exam:    No tenderness, masses, or nipple abnormality  Abdomen:     Soft, non-tender, bowel sounds active all four quadrants, no masses, no organomegaly.    Genitalia:    Pelvic:external genitalia normal, vagina without  lesions, discharge, or tenderness, rectovaginal septum  normal. Cervix normal in appearance, no cervical motion tenderness, no adnexal masses or tenderness.  Uterus normal size, shape, mobile, regular contours, nontender.  Rectal:    Normal external sphincter.  No hemorrhoids appreciated. Internal exam not done.   Extremities:   Extremities normal, atraumatic, no cyanosis or edema  Pulses:   2+ and symmetric all extremities  Skin:   Skin color, texture, turgor normal, no rashes or lesions  Lymph nodes:   Cervical, supraclavicular, and axillary nodes normal  Neurologic:   CNII-XII intact, normal strength, sensation and reflexes throughout   .  Labs:  Lab Results  Component Value Date   WBC 4.2 02/26/2023   HGB 13.8 02/26/2023   HCT 40.2 02/26/2023   MCV 85.7 02/26/2023   PLT 301 02/26/2023    Lab Results  Component Value Date   CREATININE 0.89 02/26/2023   BUN 15 02/26/2023   NA 135 02/26/2023   K 3.4 (L) 02/26/2023   CL 105 02/26/2023   CO2 23 02/26/2023     Lab Results  Component Value Date   ALT 14 02/26/2023   AST 19 02/26/2023   ALKPHOS 53 02/26/2023   BILITOT 0.7 02/26/2023    Lab Results  Component Value Date   TSH 1.180 06/23/2020     Assessment:   No diagnosis found.   Plan:  Blood tests: UTD with [facility]. Breast self exam technique reviewed and patient encouraged to perform self-exam monthly. Contraception: Depo-Provera  injections.Will switch to patch for now. Considering Nexplanon Discussed healthy lifestyle modifications. Mammogram : not age appropriate Pap smear UTD. Flu vaccine: N/A Follow up in 1 year for annual exam  Reviewed healthy vaginal care and soaps without fragrance or  dye.      Damien Parsley, CNM [facility]

## 2023-10-12 NOTE — Patient Instructions (Signed)
 Preventive Care 26-26 Years Old, Female Preventive care refers to lifestyle choices and visits with your health care provider that can promote health and wellness. Preventive care visits are also called wellness exams. What can I expect for my preventive care visit? Counseling During your preventive care visit, your health care provider may ask about your: Medical history, including: Past medical problems. Family medical history. Pregnancy history. Current health, including: Menstrual cycle. Method of birth control. Emotional well-being. Home life and relationship well-being. Sexual activity and sexual health. Lifestyle, including: Alcohol, nicotine or tobacco, and drug use. Access to firearms. Diet, exercise, and sleep habits. Work and work Astronomer. Sunscreen use. Safety issues such as seatbelt and bike helmet use. Physical exam Your health care provider may check your: Height and weight. These may be used to calculate your BMI (body mass index). BMI is a measurement that tells if you are at a healthy weight. Waist circumference. This measures the distance around your waistline. This measurement also tells if you are at a healthy weight and may help predict your risk of certain diseases, such as type 2 diabetes and high blood pressure. Heart rate and blood pressure. Body temperature. Skin for abnormal spots. What immunizations do I need?  Vaccines are usually given at various ages, according to a schedule. Your health care provider will recommend vaccines for you based on your age, medical history, and lifestyle or other factors, such as travel or where you work. What tests do I need? Screening Your health care provider may recommend screening tests for certain conditions. This may include: Pelvic exam and Pap test. Lipid and cholesterol levels. Diabetes screening. This is done by checking your blood sugar (glucose) after you have not eaten for a while (fasting). Hepatitis  B test. Hepatitis C test. HIV (human immunodeficiency virus) test. STI (sexually transmitted infection) testing, if you are at risk. BRCA-related cancer screening. This may be done if you have a family history of breast, ovarian, tubal, or peritoneal cancers. Talk with your health care provider about your test results, treatment options, and if necessary, the need for more tests. Follow these instructions at home: Eating and drinking  Eat a healthy diet that includes fresh fruits and vegetables, whole grains, lean protein, and low-fat dairy products. Take vitamin and mineral supplements as recommended by your health care provider. Do not drink alcohol if: Your health care provider tells you not to drink. You are pregnant, may be pregnant, or are planning to become pregnant. If you drink alcohol: Limit how much you have to 0-1 drink a day. Know how much alcohol is in your drink. In the [place] one drink equals one 12 oz bottle of beer (355 mL), one 5 oz glass of wine (148 mL), or one 1 oz glass of hard liquor (44 mL). Lifestyle Brush your teeth every morning and night with fluoride toothpaste. Floss one time each day. Exercise for at least 30 minutes 5 or more days each week. Do not use any products that contain nicotine or tobacco. These products include cigarettes, chewing tobacco, and vaping devices, such as e-cigarettes. If you need help quitting, ask your health care provider. Do not use drugs. If you are sexually active, practice safe sex. Use a condom or other form of protection to prevent STIs. If you do not wish to become pregnant, use a form of birth control. If you plan to become pregnant, see your health care provider for a prepregnancy visit. Find healthy ways to manage stress, such as: Meditation,  yoga, or listening to music. Journaling. Talking to a trusted person. Spending time with friends and family. Minimize exposure to UV radiation to reduce your risk of skin  cancer. Safety Always wear your seat belt while driving or riding in a vehicle. Do not drive: If you have been drinking alcohol. Do not ride with someone who has been drinking. If you have been using any mind-altering substances or drugs. While texting. When you are tired or distracted. Wear a helmet and other protective equipment during sports activities. If you have firearms in your house, make sure you follow all gun safety procedures. Seek help if you have been physically or sexually abused. What's next? Go to your health care provider once a year for an annual wellness visit. Ask your health care provider how often you should have your eyes and teeth checked. Stay up to date on all vaccines. This information is not intended to replace advice given to you by your health care provider. Make sure you discuss any questions you have with your health care provider. Document Revised: 08/25/2020 Document Reviewed: 08/25/2020 Elsevier Patient Education  2024 Elsevier Inc. Breast Self-Awareness Breast self-awareness is knowing how your breasts look and feel. You need to: Check your breasts on a regular basis. Tell your doctor about any changes. Become familiar with the look and feel of your breasts. This can help you catch a breast problem while it is still small and can be treated. You should do breast self-exams even if you have breast implants. What you need: A mirror. A well-lit room. A pillow or other soft object. How to do a breast self-exam Follow these steps to do a breast self-exam: Look for changes  Take off all the clothes above your waist. Stand in front of a mirror in a room with good lighting. Put your hands down at your sides. Compare your breasts in the mirror. Look for any difference between them, such as: A difference in shape. A difference in size. Wrinkles, dips, and bumps in one breast and not the other. Look at each breast for changes in the skin, such  as: Redness. Scaly areas. Skin that has gotten thicker. Dimpling. Open sores (ulcers). Look for changes in your nipples, such as: Fluid coming out of a nipple. Fluid around a nipple. Bleeding. Dimpling. Redness. A nipple that looks pushed in (retracted), or that has changed position. Feel for changes Lie on your back. Feel each breast. To do this: Pick a breast to feel. Place a pillow under the shoulder closest to that breast. Put the arm closest to that breast behind your head. Feel the nipple area of that breast using the hand of your other arm. Feel the area with the pads of your three middle fingers by making small circles with your fingers. Use light, medium, and firm pressure. Continue the overlapping circles, moving downward over the breast. Keep making circles with your fingers. Stop when you feel your ribs. Start making circles with your fingers again, this time going upward until you reach your collarbone. Then, make circles outward across your breast and into your armpit area. Squeeze your nipple. Check for discharge and lumps. Repeat these steps to check your other breast. Sit or stand in the tub or shower. With soapy water on your skin, feel each breast the same way you did when you were lying down. Write down what you find Writing down what you find can help you remember what to tell your doctor. Write down: What is  normal for each breast. Any changes you find in each breast. These include: The kind of changes you find. A tender or painful breast. Any lump you find. Write down its size and where it is. When you last had your monthly period (menstrual cycle). General tips If you are breastfeeding, the best time to check your breasts is after you feed your baby or after you use a breast pump. If you get monthly bleeding, the best time to check your breasts is 5-7 days after your monthly cycle ends. With time, you will become comfortable with the self-exam. You will  also start to know if there are changes in your breasts. Contact a doctor if: You see a change in the shape or size of your breasts or nipples. You see a change in the skin of your breast or nipples, such as red or scaly skin. You have fluid coming from your nipples that is not normal. You find a new lump or thick area. You have breast pain. You have any concerns about your breast health. Summary Breast self-awareness includes looking for changes in your breasts and feeling for changes within your breasts. You should do breast self-awareness in front of a mirror in a well-lit room. If you get monthly periods (menstrual cycles), the best time to check your breasts is 5-7 days after your period ends. Tell your doctor about any changes you see in your breasts. Changes include changes in size, changes on the skin, painful or tender breasts, or fluid from your nipples that is not normal. This information is not intended to replace advice given to you by your health care provider. Make sure you discuss any questions you have with your health care provider. Document Revised: 08/04/2021 Document Reviewed: 12/30/2020 Elsevier Patient Education  2024 [employer].

## 2023-12-06 ENCOUNTER — Ambulatory Visit: Payer: Self-pay

## 2023-12-12 ENCOUNTER — Ambulatory Visit: Payer: Self-pay

## 2023-12-18 ENCOUNTER — Ambulatory Visit (LOCAL_COMMUNITY_HEALTH_CENTER): Payer: Self-pay

## 2023-12-18 VITALS — BP 125/72 | HR 86 | Ht 69.0 in | Wt 165.0 lb

## 2023-12-18 DIAGNOSIS — Z30013 Encounter for initial prescription of injectable contraceptive: Secondary | ICD-10-CM

## 2023-12-18 DIAGNOSIS — Z3202 Encounter for pregnancy test, result negative: Secondary | ICD-10-CM

## 2023-12-18 DIAGNOSIS — Z3042 Encounter for surveillance of injectable contraceptive: Secondary | ICD-10-CM

## 2023-12-18 DIAGNOSIS — Z3009 Encounter for other general counseling and advice on contraception: Secondary | ICD-10-CM

## 2023-12-18 LAB — PREGNANCY, URINE: Preg Test, Ur: NEGATIVE

## 2023-12-18 MED ORDER — MEDROXYPROGESTERONE ACETATE 150 MG/ML IM SUSP
150.0000 mg | INTRAMUSCULAR | Status: AC
  Administered 2023-12-18 – 2024-03-14 (×2): 150 mg via INTRAMUSCULAR

## 2023-12-18 NOTE — Progress Notes (Addendum)
 Here for Depo IM Injection. Injection given to pt at the Lt deltoid and pt tolerated well to injection. Reminder card given and condoms declined, Kwadwo Sharolyn Weber,RN.

## 2023-12-18 NOTE — Progress Notes (Signed)
 [employer] [facility] HE[facility] DEPARTMENT [facility] [address] [place] [address] Main phone: [phone]  Family Planning Visit - Initial Visit  Subjective:  Savannah Carson is a 28 y.o.  G1P1001   being seen today for an initial annual visit and to discuss reproductive life planning.  The patient is currently using hormonal implant for pregnancy prevention. Patient does not want a pregnancy in the next year.   Patient has the following medical conditions: Patient Active Problem List   Diagnosis Date Noted   Irregular bleeding 07/19/2023   Asthma without status asthmaticus 06/23/2020   Allergic rhinitis 06/23/2020   Marijuana use 02/04/2019   Anxiety 02/04/2019   Chief Complaint  Patient presents with   Contraception   HPI Patient reports desire for Depo. First time at [facility]. Depo previously [facility], last injection was 09/13/23. No periods with Depo, sometimes has spotting. UPT negative today. Pap not due - normal in 2024.  Patient denies concerns today. Declines STI testing.  Review of Systems  All other systems reviewed and are negative.  Diabetes screening This patient is 28 y.o. with a BMI of Body mass index is 24.37 kg/m.SABRA  Is patient eligible for diabetes screening (age >35 and BMI >25)?  no  Was Hgb A1c ordered? no  STI screening STI done recently - May 2025 Declines STI testing  Cervical Cancer Screening  Result Date Procedure Results Follow-ups  09/25/2022 Cytology - PAP Neisseria Gonorrhea: Negative Chlamydia: Negative Trichomonas: Positive (A) Adequacy: Satisfactory for evaluation; transformation zone component PRESENT. Diagnosis: - Negative for intraepithelial lesion or malignancy (NILM) Comment: Normal Reference Range Trichomonas - Negative Comment: Normal Reference Ranger Chlamydia - Negative Comment: Normal Reference Range Neisseria Gonorrhea - Negative   06/10/2019 Cytology - PAP Chlamydia:  Negative Neisseria Gonorrhea: Negative Trichomonas: Negative Adequacy: Satisfactory for evaluation; transformation zone component PRESENT. Diagnosis: - Negative for intraepithelial lesion or malignancy (NILM) Comment: Normal Reference Ranger Chlamydia - Negative Comment: Normal Reference Range Neisseria Gonorrhea - Negative Comment: Normal Reference Range Trichomonas - Negative     Health Maintenance Due  Topic Date Due   HPV VACCINES (1 - 3-dose series) Never done   Pneumococcal Vaccine (1 of 2 - PCV) Never done   Influenza Vaccine  10/12/2023   COVID-19 Vaccine (4 - 2025-26 season) 11/12/2023   The following portions of the patient's history were reviewed and updated as appropriate: allergies, current medications, past family history, past medical history, past social history, past surgical history and problem list. Problem list updated.  See flowsheet for further details and programmatic requirements Hyperlink available at the top of the signed note in blue.  Flow sheet content below:  Pregnancy Intention Screening Does the patient want to become pregnant in the next year?: No Does the patient's partner want to become pregnant in the next year?: No Would the patient like to discuss contraceptive options today?: Yes  Objective:   Vitals:   12/18/23 1513  BP: 125/72  Pulse: 86  Weight: 165 lb (74.8 kg)  Height: 5' 9 (1.753 m)    Physical Exam Constitutional:      Appearance: Normal appearance.  HENT:     Head: Normocephalic.     Mouth/Throat:     Mouth: Mucous membranes are moist.     Pharynx: Oropharynx is clear. No pharyngeal swelling, oropharyngeal exudate or posterior oropharyngeal erythema.  Eyes:     General: No scleral icterus.       Right eye: No discharge.  Left eye: No discharge.  Cardiovascular:     Heart sounds: Normal heart sounds, S1 normal and S2 normal.  Pulmonary:     Effort: Pulmonary effort is normal.     Breath sounds: Normal breath  sounds.  Abdominal:     General: Abdomen is flat.     Palpations: Abdomen is soft.     Tenderness: There is no abdominal tenderness. There is no right CVA tenderness, left CVA tenderness or rebound.  Lymphadenopathy:     Cervical: No cervical adenopathy.  Skin:    General: Skin is warm and dry.  Neurological:     Mental Status: She is alert.  Psychiatric:        Mood and Affect: Mood normal.        Behavior: Behavior normal.    Assessment and Plan:  Savannah Carson is a 28 y.o. female presenting to the [facility] Department for an initial annual wellness/contraceptive visit  1. Family planning (Primary)  Contraception counseling:  Reviewed options based on patient desire and reproductive life plan. Patient is interested in Hormonal Injection. This was provided to the patient today.   Risks, benefits, and typical effectiveness rates were reviewed.  Questions were answered.  Written information was also given to the patient to review.    The patient will follow up in  3 months for surveillance.  The patient was told to call with any further questions, or with any concerns about this method of contraception.  Emphasized use of condoms 100% of the time for STI prevention.  Emergency Contraception Precautions (ECP): Patient assessed for need of ECP. She is not a candidate based on no unprotected sex in last 3-5 days.  2. Encounter for Depo-Provera  contraception  - Pregnancy, urine - medroxyPROGESTERone  (DEPO-PROVERA ) injection 150 mg   Return in about 11 weeks (around 03/04/2024).  No future appointments.  Damien FORBES Satchel, NP

## 2024-02-13 ENCOUNTER — Ambulatory Visit: Payer: Self-pay

## 2024-03-04 ENCOUNTER — Ambulatory Visit: Payer: Self-pay

## 2024-03-14 ENCOUNTER — Ambulatory Visit: Payer: Self-pay

## 2024-03-14 VITALS — BP 124/78 | Ht 69.0 in | Wt 158.0 lb

## 2024-03-14 DIAGNOSIS — Z3009 Encounter for other general counseling and advice on contraception: Secondary | ICD-10-CM

## 2024-03-14 DIAGNOSIS — Z30013 Encounter for initial prescription of injectable contraceptive: Secondary | ICD-10-CM | POA: Diagnosis not present

## 2024-03-14 DIAGNOSIS — Z3042 Encounter for surveillance of injectable contraceptive: Secondary | ICD-10-CM

## 2024-03-14 NOTE — Progress Notes (Signed)
 12 Weeks   3 Days since last Depo    Voices no concerns today.  Counseled to adhere to 11 to 13 week intervals between depo injections for optimal benefit.  Depo consent signed today.  Depo given today per order by FORBES Satchel, [facility]  dated 12/18/2023.  Tolerated well R deltoid.  Next depo due 05/30/2024,  has reminder card.  Weslie Pretlow, RN

## 2024-04-02 ENCOUNTER — Ambulatory Visit: Payer: Self-pay

## 2024-04-07 ENCOUNTER — Ambulatory Visit: Payer: Self-pay
